# Patient Record
Sex: Female | Born: 1985 | Race: Black or African American | Hispanic: No | Marital: Single | Smoking: Current every day smoker
Health system: Southern US, Community
[De-identification: ages and names within clinical notes are randomized; demographics above are authoritative.]

## PROBLEM LIST (undated history)

## (undated) DIAGNOSIS — E669 Obesity, unspecified: Secondary | ICD-10-CM

---
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
## 2004-09-05 ENCOUNTER — Ambulatory Visit (HOSPITAL_COMMUNITY): Admission: RE | Admit: 2004-09-05 | Discharge: 2004-09-05 | Payer: Self-pay | Admitting: Family Medicine

## 2005-04-22 ENCOUNTER — Emergency Department (HOSPITAL_COMMUNITY): Admission: EM | Admit: 2005-04-22 | Discharge: 2005-04-23 | Payer: Self-pay | Admitting: Emergency Medicine

## 2006-02-22 ENCOUNTER — Emergency Department (HOSPITAL_COMMUNITY): Admission: EM | Admit: 2006-02-22 | Discharge: 2006-02-22 | Payer: Self-pay | Admitting: Emergency Medicine

## 2006-07-03 ENCOUNTER — Emergency Department (HOSPITAL_COMMUNITY): Admission: EM | Admit: 2006-07-03 | Discharge: 2006-07-03 | Payer: Self-pay | Admitting: Emergency Medicine

## 2006-10-05 ENCOUNTER — Emergency Department (HOSPITAL_COMMUNITY): Admission: EM | Admit: 2006-10-05 | Discharge: 2006-10-05 | Payer: Self-pay | Admitting: Emergency Medicine

## 2007-01-16 ENCOUNTER — Emergency Department (HOSPITAL_COMMUNITY): Admission: EM | Admit: 2007-01-16 | Discharge: 2007-01-16 | Payer: Self-pay | Admitting: Family Medicine

## 2007-11-06 ENCOUNTER — Emergency Department (HOSPITAL_COMMUNITY): Admission: EM | Admit: 2007-11-06 | Discharge: 2007-11-06 | Payer: Self-pay | Admitting: Emergency Medicine

## 2008-02-05 ENCOUNTER — Emergency Department (HOSPITAL_COMMUNITY): Admission: EM | Admit: 2008-02-05 | Discharge: 2008-02-05 | Payer: Self-pay | Admitting: Emergency Medicine

## 2008-04-09 ENCOUNTER — Emergency Department (HOSPITAL_COMMUNITY): Admission: EM | Admit: 2008-04-09 | Discharge: 2008-04-09 | Payer: Self-pay | Admitting: Emergency Medicine

## 2008-08-02 ENCOUNTER — Emergency Department (HOSPITAL_COMMUNITY): Admission: EM | Admit: 2008-08-02 | Discharge: 2008-08-02 | Payer: Self-pay | Admitting: Family Medicine

## 2008-08-05 ENCOUNTER — Emergency Department (HOSPITAL_COMMUNITY): Admission: EM | Admit: 2008-08-05 | Discharge: 2008-08-05 | Payer: Self-pay | Admitting: Emergency Medicine

## 2009-09-01 ENCOUNTER — Emergency Department (HOSPITAL_COMMUNITY): Admission: EM | Admit: 2009-09-01 | Discharge: 2009-09-01 | Payer: Self-pay | Admitting: Emergency Medicine

## 2009-12-27 ENCOUNTER — Inpatient Hospital Stay (HOSPITAL_COMMUNITY)
Admission: AD | Admit: 2009-12-27 | Discharge: 2009-12-28 | Payer: Self-pay | Source: Home / Self Care | Admitting: Obstetrics & Gynecology

## 2010-01-26 ENCOUNTER — Ambulatory Visit: Payer: Self-pay | Admitting: Obstetrics and Gynecology

## 2010-04-19 LAB — POCT PREGNANCY, URINE: Preg Test, Ur: NEGATIVE

## 2010-04-23 LAB — POCT PREGNANCY, URINE: Preg Test, Ur: NEGATIVE

## 2010-04-23 LAB — CBC
HCT: 32.5 % — ABNORMAL LOW (ref 36.0–46.0)
Hemoglobin: 10.9 g/dL — ABNORMAL LOW (ref 12.0–15.0)
MCH: 27.8 pg (ref 26.0–34.0)
MCHC: 33.7 g/dL (ref 30.0–36.0)
MCV: 82.6 fL (ref 78.0–100.0)
RBC: 3.94 MIL/uL (ref 3.87–5.11)

## 2010-04-23 LAB — URINALYSIS, ROUTINE W REFLEX MICROSCOPIC
Ketones, ur: NEGATIVE mg/dL
Nitrite: NEGATIVE
Urobilinogen, UA: 0.2 mg/dL (ref 0.0–1.0)
pH: 5.5 (ref 5.0–8.0)

## 2010-05-16 LAB — URINALYSIS, ROUTINE W REFLEX MICROSCOPIC
Bilirubin Urine: NEGATIVE
Hgb urine dipstick: NEGATIVE
Protein, ur: NEGATIVE mg/dL
Urobilinogen, UA: 0.2 mg/dL (ref 0.0–1.0)

## 2010-05-16 LAB — CBC
Hemoglobin: 12.3 g/dL (ref 12.0–15.0)
MCHC: 33.1 g/dL (ref 30.0–36.0)
MCV: 79.8 fL (ref 78.0–100.0)
Platelets: 299 10*3/uL (ref 150–400)
RBC: 4.79 MIL/uL (ref 3.87–5.11)
RDW: 16.2 % — ABNORMAL HIGH (ref 11.5–15.5)
RDW: 16.5 % — ABNORMAL HIGH (ref 11.5–15.5)
WBC: 13 10*3/uL — ABNORMAL HIGH (ref 4.0–10.5)

## 2010-05-16 LAB — HEMOCCULT GUIAC POC 1CARD (OFFICE): Fecal Occult Bld: NEGATIVE

## 2010-05-16 LAB — POCT I-STAT, CHEM 8
Creatinine, Ser: 0.7 mg/dL (ref 0.4–1.2)
Glucose, Bld: 93 mg/dL (ref 70–99)
HCT: 38 % (ref 36.0–46.0)
Hemoglobin: 12.9 g/dL (ref 12.0–15.0)
Potassium: 3.5 mEq/L (ref 3.5–5.1)
Sodium: 138 mEq/L (ref 135–145)
TCO2: 23 mmol/L (ref 0–100)

## 2010-05-16 LAB — COMPREHENSIVE METABOLIC PANEL
ALT: 23 U/L (ref 0–35)
AST: 19 U/L (ref 0–37)
Albumin: 3.6 g/dL (ref 3.5–5.2)
Albumin: 3.8 g/dL (ref 3.5–5.2)
Alkaline Phosphatase: 54 U/L (ref 39–117)
Calcium: 8.5 mg/dL (ref 8.4–10.5)
Chloride: 106 mEq/L (ref 96–112)
Creatinine, Ser: 0.7 mg/dL (ref 0.4–1.2)
GFR calc Af Amer: >60 mL/min (ref >60)
GFR calc non Af Amer: >60 mL/min (ref >60)
Glucose, Bld: 88 mg/dL (ref 70–99)
Potassium: 3.9 mEq/L (ref 3.5–5.1)
Sodium: 138 mEq/L (ref 135–145)
Total Bilirubin: 0.6 mg/dL (ref 0.3–1.2)
Total Protein: 7.3 g/dL (ref 6.0–8.3)

## 2010-05-16 LAB — POCT URINALYSIS DIP (DEVICE)
Glucose, UA: NEGATIVE mg/dL
Specific Gravity, Urine: >=1.03 (ref 1.005–1.030)
Urobilinogen, UA: 1 mg/dL (ref 0.0–1.0)

## 2010-05-16 LAB — DIFFERENTIAL
Basophils Absolute: 0 10*3/uL (ref 0.0–0.1)
Basophils Relative: 0 % (ref 0–1)
Basophils Relative: 0 % (ref 0–1)
Eosinophils Absolute: 0.2 10*3/uL (ref 0.0–0.7)
Eosinophils Absolute: 0.4 10*3/uL (ref 0.0–0.7)
Eosinophils Relative: 3 % (ref 0–5)
Lymphocytes Relative: 15 % (ref 12–46)
Lymphs Abs: 1.9 10*3/uL (ref 0.7–4.0)
Monocytes Absolute: 1.1 10*3/uL — ABNORMAL HIGH (ref 0.1–1.0)
Monocytes Absolute: 1.1 10*3/uL — ABNORMAL HIGH (ref 0.1–1.0)
Monocytes Relative: 8 % (ref 3–12)
Neutrophils Relative %: 76 % (ref 43–77)

## 2010-05-16 LAB — POCT PREGNANCY, URINE: Preg Test, Ur: NEGATIVE

## 2010-09-24 ENCOUNTER — Emergency Department (HOSPITAL_COMMUNITY)
Admission: EM | Admit: 2010-09-24 | Discharge: 2010-09-24 | Disposition: A | Discharge status: Home / Self Care | Payer: Self-pay | Attending: Emergency Medicine | Admitting: Emergency Medicine

## 2010-09-24 DIAGNOSIS — I10 Essential (primary) hypertension: Secondary | ICD-10-CM | POA: Insufficient documentation

## 2010-09-24 DIAGNOSIS — K589 Irritable bowel syndrome without diarrhea: Secondary | ICD-10-CM | POA: Insufficient documentation

## 2010-09-24 DIAGNOSIS — N898 Other specified noninflammatory disorders of vagina: Secondary | ICD-10-CM | POA: Insufficient documentation

## 2010-09-24 DIAGNOSIS — E669 Obesity, unspecified: Secondary | ICD-10-CM | POA: Insufficient documentation

## 2010-11-11 LAB — POCT I-STAT, CHEM 8
Chloride: 104 mEq/L (ref 96–112)
Creatinine, Ser: 0.9 mg/dL (ref 0.4–1.2)
HCT: 39 % (ref 36.0–46.0)
Hemoglobin: 13.3 g/dL (ref 12.0–15.0)

## 2011-01-19 ENCOUNTER — Emergency Department (HOSPITAL_COMMUNITY)
Admission: EM | Admit: 2011-01-19 | Discharge: 2011-01-19 | Discharge status: Against Medical Advice | Payer: Self-pay | Attending: Emergency Medicine | Admitting: Emergency Medicine

## 2011-01-19 DIAGNOSIS — Z0389 Encounter for observation for other suspected diseases and conditions ruled out: Secondary | ICD-10-CM | POA: Insufficient documentation

## 2011-01-19 NOTE — ED Notes (Signed)
Pt reports that she "is leaving and will come back later" that she has somewhere else to be

## 2011-01-19 NOTE — ED Provider Notes (Signed)
History     CSN: [id number] Arrival date & time: 01/19/2011  1:00 PM   First MD Initiated Contact with Patient 01/19/11 1503      Chief Complaint  Patient presents with  . Influenza    (Consider location/radiation/quality/duration/timing/severity/associated sxs/prior treatment) HPI  History reviewed. No pertinent past medical history.  History reviewed. No pertinent past surgical history.  History reviewed. No pertinent family history.  History  Substance Use Topics  . Smoking status: Current Everyday Smoker    Types: Cigarettes  . Smokeless tobacco: Never Used  . Alcohol Use: No    OB History    Grav Para Term Preterm Abortions TAB SAB Ect Mult Living                  Review of Systems  Allergies  Review of patient's allergies indicates no known allergies.  Home Medications   Current Outpatient Rx  Name Route Sig Dispense Refill  . LEVONORGEST-ETH ESTRAD 91-DAY 0.15-0.03 MG PO TABS Oral Take 1 tablet by mouth daily.        BP 149/72  Pulse 99  Temp(Src) 98.7 F (37.1 C) (Oral)  Resp 20  Ht 5\' 2"  (1.575 m)  Wt 230 lb (104.327 kg)  BMI 42.07 kg/m2  SpO2 99%  LMP 01/19/2011  Physical Exam  ED Course  Procedures (including critical care time)  Labs Reviewed - No data to display No results found.   No diagnosis found.    MDM  Not my patient        Doug Sou, MD 01/19/11 2037

## 2011-01-19 NOTE — ED Notes (Signed)
Symptoms began on Tuesday, sob, body aches, n/v/d, chest congestion

## 2011-01-20 ENCOUNTER — Encounter (HOSPITAL_COMMUNITY): Payer: Self-pay | Admitting: Emergency Medicine

## 2011-01-20 ENCOUNTER — Emergency Department (HOSPITAL_COMMUNITY)
Admission: EM | Admit: 2011-01-20 | Discharge: 2011-01-20 | Disposition: A | Discharge status: Home / Self Care | Payer: Self-pay | Attending: Emergency Medicine | Admitting: Emergency Medicine

## 2011-01-20 ENCOUNTER — Emergency Department (HOSPITAL_COMMUNITY): Payer: Self-pay

## 2011-01-20 DIAGNOSIS — R05 Cough: Secondary | ICD-10-CM | POA: Insufficient documentation

## 2011-01-20 DIAGNOSIS — B9789 Other viral agents as the cause of diseases classified elsewhere: Secondary | ICD-10-CM | POA: Insufficient documentation

## 2011-01-20 DIAGNOSIS — R070 Pain in throat: Secondary | ICD-10-CM | POA: Insufficient documentation

## 2011-01-20 DIAGNOSIS — B349 Viral infection, unspecified: Secondary | ICD-10-CM

## 2011-01-20 DIAGNOSIS — IMO0001 Reserved for inherently not codable concepts without codable children: Secondary | ICD-10-CM | POA: Insufficient documentation

## 2011-01-20 DIAGNOSIS — R630 Anorexia: Secondary | ICD-10-CM | POA: Insufficient documentation

## 2011-01-20 DIAGNOSIS — R059 Cough, unspecified: Secondary | ICD-10-CM | POA: Insufficient documentation

## 2011-01-20 DIAGNOSIS — R0989 Other specified symptoms and signs involving the circulatory and respiratory systems: Secondary | ICD-10-CM | POA: Insufficient documentation

## 2011-01-20 DIAGNOSIS — R112 Nausea with vomiting, unspecified: Secondary | ICD-10-CM | POA: Insufficient documentation

## 2011-01-20 DIAGNOSIS — R0602 Shortness of breath: Secondary | ICD-10-CM | POA: Insufficient documentation

## 2011-01-20 DIAGNOSIS — R197 Diarrhea, unspecified: Secondary | ICD-10-CM | POA: Insufficient documentation

## 2011-01-20 MED ORDER — ALBUTEROL SULFATE HFA 108 (90 BASE) MCG/ACT IN AERS
2.0000 | INHALATION_SPRAY | RESPIRATORY_TRACT | Status: AC
  Administered 2011-01-20: 2 via RESPIRATORY_TRACT
  Filled 2011-01-20: qty 6.7

## 2011-01-20 MED ORDER — ALBUTEROL SULFATE (5 MG/ML) 0.5% IN NEBU
5.0000 mg | INHALATION_SOLUTION | Freq: Once | RESPIRATORY_TRACT | Status: AC
  Administered 2011-01-20: 5 mg via RESPIRATORY_TRACT
  Filled 2011-01-20: qty 1

## 2011-01-20 MED ORDER — IBUPROFEN 800 MG PO TABS: 800.0000 mg | ORAL_TABLET | Freq: Three times a day (TID) | ORAL | Status: AC

## 2011-01-20 MED ORDER — PROMETHAZINE HCL 25 MG PO TABS: 25.0000 mg | ORAL_TABLET | Freq: Four times a day (QID) | ORAL | Status: AC | PRN

## 2011-01-20 MED ORDER — IPRATROPIUM BROMIDE 0.02 % IN SOLN
0.5000 mg | Freq: Once | RESPIRATORY_TRACT | Status: AC
  Administered 2011-01-20: 0.5 mg via RESPIRATORY_TRACT
  Filled 2011-01-20: qty 2.5

## 2011-01-20 NOTE — ED Notes (Signed)
RT paged for [facility]

## 2011-01-20 NOTE — ED Notes (Signed)
PT. REPORTS PERSISTENT PRODUCTIVE COUGH WITH VOMITTING / DIARRHEA , CHILLS AND LOW GRADE FEVER FOR 4 DAYS.

## 2011-01-20 NOTE — Progress Notes (Signed)
Instructed pt on use of Albuterol MDI.  Pt demonstrated and shows good understanding of use.

## 2011-01-20 NOTE — ED Provider Notes (Signed)
Medical screening examination/treatment/procedure(s) were performed by non-physician practitioner and as supervising physician I was immediately available for consultation/collaboration.   Celene Kras, MD 01/20/11 [phone]

## 2011-01-20 NOTE — ED Notes (Signed)
Patient transported to X-ray 

## 2011-01-20 NOTE — ED Notes (Signed)
C/o prod cough, chest congestion, SOB, generalized body aches. since Tuesday. N/v/d with emesis x2 & diarrhea x 3-4. + chills, denies fever

## 2011-01-20 NOTE — ED Provider Notes (Signed)
History     CSN: [id number] Arrival date & time: 01/20/2011  6:49 AM   First MD Initiated Contact with Patient 01/20/11 [phone]      Chief Complaint  Patient presents with  . Cough    (Consider location/radiation/quality/duration/timing/severity/associated sxs/prior treatment) HPI History provided by pt.   Pt has had cough, SOB that is aggravated by exertion and chest congestion for the past 4 days.  Associated w/ nasal congestion, rhinorrhea, sore throat, N/V/D, decreased appetite and body aches.  Denies fever and abdominal pain.  No PMH.  Smokes cigarettes.    History reviewed. No pertinent past medical history.  History reviewed. No pertinent past surgical history.  No family history on file.  History  Substance Use Topics  . Smoking status: Current Everyday Smoker    Types: Cigarettes  . Smokeless tobacco: Never Used  . Alcohol Use: No    OB History    Grav Para Term Preterm Abortions TAB SAB Ect Mult Living                  Review of Systems  All other systems reviewed and are negative.    Allergies  Review of patient's allergies indicates no known allergies.  Home Medications   Current Outpatient Rx  Name Route Sig Dispense Refill  . LEVONORGEST-ETH ESTRAD 91-DAY 0.15-0.03 MG PO TABS Oral Take 1 tablet by mouth daily.        BP 145/70  Pulse 124  Temp(Src) 98.8 F (37.1 C) (Oral)  Resp 24  SpO2 99%  LMP 01/12/2011  Physical Exam  Nursing note and vitals reviewed. Constitutional: She is oriented to person, place, and time. She appears well-developed and well-nourished. No distress.       Morbidly obese.  Eating [employer].    HENT:  Head: No trismus in the jaw.  Right Ear: Tympanic membrane, external ear and ear canal normal.  Left Ear: Tympanic membrane, external ear and ear canal normal.  Mouth/Throat: Uvula is midline and mucous membranes are normal. No oropharyngeal exudate, posterior oropharyngeal edema or posterior oropharyngeal erythema.      Nasal congestion  Eyes:       Normal appearance  Neck: Normal range of motion. Neck supple.  Cardiovascular: Regular rhythm.        Tachycardia  Pulmonary/Chest: Effort normal and breath sounds normal. No respiratory distress.       Diffuse inspiratory wheezing.  Coughing.  Abdominal: Soft. Bowel sounds are normal. She exhibits no distension. There is no tenderness.  Musculoskeletal: Normal range of motion.  Lymphadenopathy:    She has no cervical adenopathy.  Neurological: She is alert and oriented to person, place, and time.  Skin: Skin is warm and dry. No rash noted.  Psychiatric: She has a normal mood and affect. Her behavior is normal.    ED Course  Procedures (including critical care time)  Labs Reviewed - No data to display Dg Chest 2 View  01/20/2011  *RADIOLOGY REPORT*  Clinical Data: Cough, shortness of breath  CHEST - 2 VIEW  Comparison: None.  Findings: No pneumonia is seen.  There are prominent perihilar markings with some peribronchial thickening which may indicate bronchitis.  The heart is within normal limits in size.  No bony abnormality is seen.  IMPRESSION: No pneumonia.  Peribronchial thickening may indicate bronchitis.  Original Report Authenticated By: Juline Patch, M.D.     1. Viral illness       MDM  Morbidly obese but otherwise healthy 25yo F  presents w/ URI sx x 4 days.  CXR ordered d/t c/o SOB, tachycardia/tachypnea on exam and pt expectation; seen in ED yesterday and told she needed a CXR but had to leave d/t time constraint.  S/sx most consistent w/ viral illness.   CXR shows peribronchial thickening.  Pt received a breathing tmnt w/ improvement in sx and vitals.  Received an albuterol inhaler.  D/c'd home w/ zofran and ibuprofen.  Return precautions discussed.       Otilio Miu, [place] 01/20/11 [phone]

## 2011-08-23 ENCOUNTER — Emergency Department (INDEPENDENT_AMBULATORY_CARE_PROVIDER_SITE_OTHER)
Admission: EM | Admit: 2011-08-23 | Discharge: 2011-08-23 | Disposition: A | Discharge status: Home / Self Care | Payer: Self-pay | Source: Home / Self Care | Attending: Emergency Medicine | Admitting: Emergency Medicine

## 2011-08-23 ENCOUNTER — Encounter (HOSPITAL_COMMUNITY): Payer: Self-pay | Admitting: Emergency Medicine

## 2011-08-23 DIAGNOSIS — R5383 Other fatigue: Secondary | ICD-10-CM

## 2011-08-23 DIAGNOSIS — Z72820 Sleep deprivation: Secondary | ICD-10-CM

## 2011-08-23 LAB — POCT I-STAT, CHEM 8
Creatinine, Ser: 0.9 mg/dL (ref 0.50–1.10)
HCT: 42 % (ref 36.0–46.0)
Hemoglobin: 14.3 g/dL (ref 12.0–15.0)
Potassium: 4 mEq/L (ref 3.5–5.1)
Sodium: 141 mEq/L (ref 135–145)

## 2011-08-23 NOTE — ED Provider Notes (Signed)
History     CSN: [id number]  Arrival date & time 08/23/11  1321   First MD Initiated Contact with Patient 08/23/11 1622      Chief Complaint  Patient presents with  . New Evaluation    (Consider location/radiation/quality/duration/timing/severity/associated sxs/prior treatment) HPI Comments: Pt c/o being "tired all the time" for a year.  Someone at work suggested she have her thyroid checked.  Does not have pcp.  Upon further questioning, pt started new job a year ago and is only able to sleep 4-5 hours per night.  Has been sleeping this little for the last year.  Pt reports always have irregular periods unless she is taking birth control, and stopped her birth control pills about 6 months ago.  In last month, periods are more irregular than usual.   Patient is a 26 y.o. female presenting with weakness. The history is provided by the patient.  Weakness Primary symptoms do not include dizziness or fever. Primary symptoms comment: fatigue Episode onset: a year ago. The symptoms are unchanged.  Additional symptoms do not include weakness. Associated symptoms comments: Irregular periods.    History reviewed. No pertinent past medical history.  History reviewed. No pertinent past surgical history.  History reviewed. No pertinent family history.  History  Substance Use Topics  . Smoking status: Current Everyday Smoker -- 0.5 packs/day for 3 years    Types: Cigarettes  . Smokeless tobacco: Never Used  . Alcohol Use: No    OB History    Grav Para Term Preterm Abortions TAB SAB Ect Mult Living                  Review of Systems  Constitutional: Positive for fatigue. Negative for fever, chills, diaphoresis and unexpected weight change.  Cardiovascular: Negative for chest pain and palpitations.  Genitourinary: Positive for menstrual problem. Negative for dysuria, vaginal bleeding, vaginal discharge, genital sores and vaginal pain.  Skin:       No skin changes in last year    Neurological: Negative for dizziness and weakness.  Hematological: Does not bruise/bleed easily.       No hx anemia    Allergies  Review of patient's allergies indicates no known allergies.  Home Medications  No current outpatient prescriptions on file.  BP 132/69  Pulse 84  Temp 98.6 F (37 C) (Oral)  Resp 16  SpO2 100%  Physical Exam  Constitutional: She is oriented to person, place, and time. She appears well-developed and well-nourished. No distress.       obese  Neck: Neck supple. No mass and no thyromegaly present.  Cardiovascular: Normal rate and regular rhythm.   Pulmonary/Chest: Effort normal and breath sounds normal.  Neurological: She is alert and oriented to person, place, and time.  Skin: Skin is warm and dry.    ED Course  Procedures (including critical care time)   Labs Reviewed  POCT I-STAT, CHEM 8  TSH   No results found.   1. Fatigue   2. Sleep deficient       MDM  Pt most likely sleep deprived, which will make her feel tired and cause irregular periods.  Istat Chem8 normal, TSH drawn but I expect it to be normal.  Pt to seek f/u with gyn or pcp when she can.         Cathlyn Parsons, NP 08/23/11 [phone]

## 2011-08-23 NOTE — ED Notes (Signed)
PT HERE FOR EVALUATION OF THYROID FOR SX IRREGULAR MENSES X1 YR,LACK OF ENERGY AND WEIGHT ISSUES.DENIES PAIN OR OTHER CONCERNS.STATES SHE WAS REFERRED TO [facility] BUT NO APPT AVAILABLE

## 2011-08-24 LAB — TSH: TSH: 1.853 u[IU]/mL (ref 0.350–4.500)

## 2011-08-24 NOTE — ED Provider Notes (Signed)
Labs reveiwed, TSH & istat normal.   Medical screening examination/treatment/procedure(s) were performed by non-physician practitioner and as supervising physician I was immediately available for consultation/collaboration.  Luiz Blare MD   Luiz Blare, MD 08/24/11 2100

## 2011-08-28 ENCOUNTER — Telehealth (HOSPITAL_COMMUNITY): Payer: Self-pay | Admitting: *Deleted

## 2011-08-28 NOTE — ED Notes (Signed)
Pt. called for her lab result.  Pt. verified x 2 and given result and range. ( TSH 1.853 WNL) Vassie Moselle 08/28/2011

## 2012-05-11 ENCOUNTER — Encounter (HOSPITAL_COMMUNITY): Payer: Self-pay | Admitting: Emergency Medicine

## 2012-05-11 ENCOUNTER — Emergency Department (INDEPENDENT_AMBULATORY_CARE_PROVIDER_SITE_OTHER)
Admission: EM | Admit: 2012-05-11 | Discharge: 2012-05-11 | Disposition: A | Discharge status: Home / Self Care | Payer: Self-pay | Source: Home / Self Care

## 2012-05-11 DIAGNOSIS — IMO0002 Reserved for concepts with insufficient information to code with codable children: Secondary | ICD-10-CM

## 2012-05-11 DIAGNOSIS — Z5189 Encounter for other specified aftercare: Secondary | ICD-10-CM

## 2012-05-11 DIAGNOSIS — L02419 Cutaneous abscess of limb, unspecified: Secondary | ICD-10-CM

## 2012-05-11 MED ORDER — DOXYCYCLINE HYCLATE 50 MG PO CAPS: 100.0000 mg | ORAL_CAPSULE | Freq: Two times a day (BID) | ORAL | Status: DC

## 2012-05-11 NOTE — ED Provider Notes (Signed)
History     CSN: [id number]  Arrival date & time 05/11/12  1131   First MD Initiated Contact with Patient 05/11/12 1141      Chief Complaint  Patient presents with  . Abscess    (Consider location/radiation/quality/duration/timing/severity/associated sxs/prior treatment) Patient is a 27 y.o. female presenting with abscess.  Abscess  This is a 27 year old female who presents with swelling in her left axilla which started one week ago. It is quite painful she thinks there is been drainage from the area. She does not have any redness as far as she can tell. She has not had any fevers. History reviewed. No pertinent past medical history.  History reviewed. No pertinent past surgical history.  No family history on file.  History  Substance Use Topics  . Smoking status: Current Every Day Smoker -- 0.50 packs/day for 3 years    Types: Cigarettes  . Smokeless tobacco: Never Used  . Alcohol Use: No    OB History   Grav Para Term Preterm Abortions TAB SAB Ect Mult Living                  Review of Systems  Constitutional: Negative.   HENT: Negative.   Eyes: Negative.   Respiratory: Negative.   Cardiovascular: Negative.   Gastrointestinal: Negative.   Endocrine: Negative.   Genitourinary: Negative.   Musculoskeletal: Negative.   Skin:       Per history of present illness  Neurological: Negative.   Psychiatric/Behavioral: Negative.     Allergies  Review of patient's allergies indicates no known allergies.  Home Medications   Current Outpatient Rx  Name  Route  Sig  Dispense  Refill  . doxycycline (VIBRAMYCIN) 50 MG capsule   Oral   Take 2 capsules (100 mg total) by mouth 2 (two) times daily.   28 capsule   0     BP 119/68  Pulse 70  Temp(Src) 97.9 F (36.6 C) (Oral)  Resp 18  SpO2 100%  LMP 04/20/2012  Physical Exam  Constitutional: She is oriented to person, place, and time. She appears well-developed and well-nourished.  HENT:  Head: Normocephalic  and atraumatic.  Eyes: Conjunctivae are normal. Pupils are equal, round, and reactive to light.  Neck: Normal range of motion. Neck supple.  Cardiovascular: Normal rate and regular rhythm.   Pulmonary/Chest: Effort normal and breath sounds normal.  Abdominal: Soft. Bowel sounds are normal.  Musculoskeletal: Normal range of motion.  Neurological: She is alert and oriented to person, place, and time.  Skin: Skin is warm and dry.  Fluctuant swelling about 2-3 inches in diameter in the right axilla. No erythema noted and no lymphadenopathy noted. currently there is no sign of any drainage.  Psychiatric: Her behavior is normal.  Quite anxious    ED Course  Procedures (including critical care time)  Labs Reviewed - No data to display No results found.   1. Abscess of axilla       MDM  Incision and drainage and packing with iodoform gauze has been performed. She has been prescribed doxycycline 100 mg twice a day. She is advised to return in 2 days or sooner if she sees increase in size of swelling or begins to develop fevers.        Calvert Cantor, MD 05/11/12 [phone]

## 2012-05-11 NOTE — ED Notes (Signed)
Pt is here for an abscess on left axilla onset 1 week Past 3 days getting bigger w/some drainage Pain increases w/activity Denies: f/v/n/d Placed warm compression to site  She is alert and oriented w/no signs of acute distress.

## 2012-05-13 ENCOUNTER — Emergency Department (INDEPENDENT_AMBULATORY_CARE_PROVIDER_SITE_OTHER)
Admission: EM | Admit: 2012-05-13 | Discharge: 2012-05-13 | Disposition: A | Discharge status: Home / Self Care | Payer: Self-pay | Source: Home / Self Care | Attending: Family Medicine | Admitting: Family Medicine

## 2012-05-13 ENCOUNTER — Encounter (HOSPITAL_COMMUNITY): Payer: Self-pay | Admitting: Emergency Medicine

## 2012-05-13 DIAGNOSIS — Z5189 Encounter for other specified aftercare: Secondary | ICD-10-CM

## 2012-05-13 NOTE — ED Notes (Signed)
Pt here for packing removal of abscess. Pt denies any new problems. Has been taking antibiotics as prescribed with no adverse reaction. Patient is alert and oriented.

## 2012-05-13 NOTE — ED Provider Notes (Signed)
History     CSN: [id number]  Arrival date & time 05/13/12  1548   First MD Initiated Contact with Patient 05/13/12 1810      Chief Complaint  Patient presents with  . Follow-up    (Consider location/radiation/quality/duration/timing/severity/associated sxs/prior treatment) HPI Comments: Pt had abscess in L axilla I&D on 4/5.  Here for wound recheck.   Patient is a 27 y.o. female presenting with wound check. The history is provided by the patient.  Wound Check This is a new problem. Episode onset: I and D was 2 days ago. The problem occurs constantly. The problem has been gradually improving. Exacerbated by: touching it. Nothing relieves the symptoms. Treatments tried: is taking antibiotics as prescribed. The treatment provided mild relief.    History reviewed. No pertinent past medical history.  History reviewed. No pertinent past surgical history.  History reviewed. No pertinent family history.  History  Substance Use Topics  . Smoking status: Current Every Day Smoker -- 0.50 packs/day for 3 years    Types: Cigarettes  . Smokeless tobacco: Never Used  . Alcohol Use: No    OB History   Grav Para Term Preterm Abortions TAB SAB Ect Mult Living                  Review of Systems  Constitutional: Negative for fever and chills.  Skin: Positive for wound.    Allergies  Review of patient's allergies indicates no known allergies.  Home Medications   Current Outpatient Rx  Name  Route  Sig  Dispense  Refill  . doxycycline (VIBRAMYCIN) 50 MG capsule   Oral   Take 2 capsules (100 mg total) by mouth 2 (two) times daily.   28 capsule   0     BP 142/83  Pulse 87  Temp(Src) 98.3 F (36.8 C) (Oral)  Resp 16  SpO2 99%  LMP 04/20/2012  Physical Exam  Constitutional: She appears well-developed and well-nourished. No distress.  Skin: Skin is warm and dry.  L axilla wound. Still with significant induration surrounding incision. Serosanguinous drainage noted. No  obvious pus. No erythema. Tender to palp.     ED Course  Wound packing Date/Time: 05/13/2012 6:15 PM Performed by: Cathlyn Parsons Authorized by: Bradd Canary D Consent: Verbal consent obtained. Consent given by: patient Patient understanding: patient states understanding of the procedure being performed Patient identity confirmed: verbally with patient Local anesthesia used: no Patient sedated: no Comments: Iodoform gauze removed, as wound is quite large, wound was repacked with 1/4" iodoform gauze.    (including critical care time)  Labs Reviewed - No data to display No results found.   1. Wound check, abscess       MDM          Cathlyn Parsons, NP 05/13/12 1816

## 2012-05-14 LAB — CULTURE, ROUTINE-ABSCESS

## 2012-05-15 NOTE — ED Provider Notes (Signed)
Medical screening examination/treatment/procedure(s) were performed by resident physician or non-physician practitioner and as supervising physician I was immediately available for consultation/collaboration.   Marie Chow DOUGLAS MD.   Amairani Shuey D Emersen Carroll, MD 05/15/12 1525 

## 2012-05-16 ENCOUNTER — Telehealth (HOSPITAL_COMMUNITY): Payer: Self-pay | Admitting: *Deleted

## 2012-05-16 NOTE — ED Notes (Signed)
Abscess culture axilla: Few Proteus Mirabilis.  Pt. treated with I and D and Doxycycline.  Doxycycline not on sensitivity report. Lab shown to Dr. Lorenz Coaster and he said call for clinical improvement. If not getting better pt. will need Cipro or Amoxicillin.I called pt. for clinical improvement. She said it is improving. She is pulling 1 " of packing out since Wednesday as instructed with minimal drainage. I told her to finish all of antibiotic. Come back if not better or getting worse in any way.  Pt. voiced understanding. Pt.'s questions answered. Vassie Moselle 05/16/2012

## 2012-07-17 IMAGING — CR DG CHEST 2V
2 series · 2 of 2 positions shown · non-contrast
Comparison: None.

CLINICAL DATA: Cough, shortness of breath

CHEST - 2 VIEW

[w chest pa]
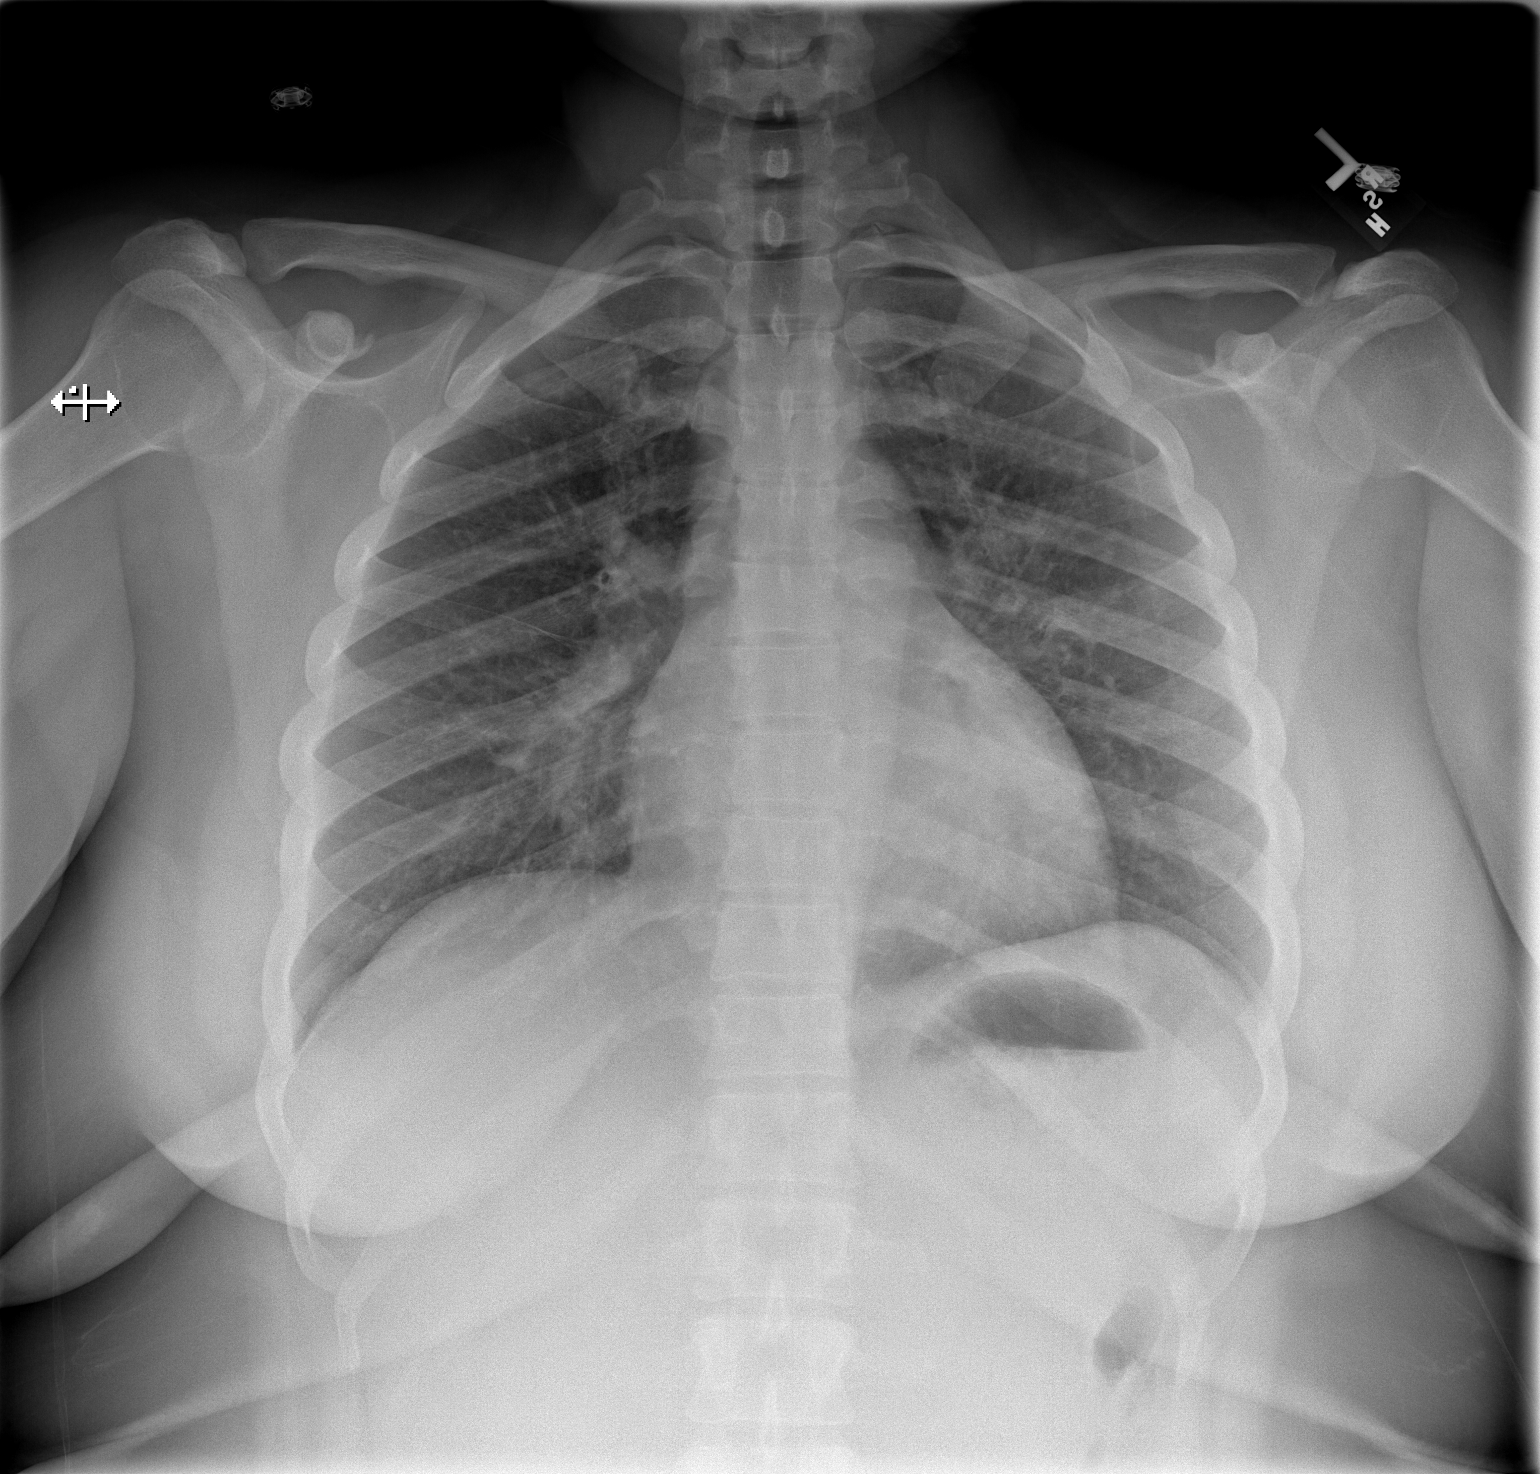

[w chest lat]
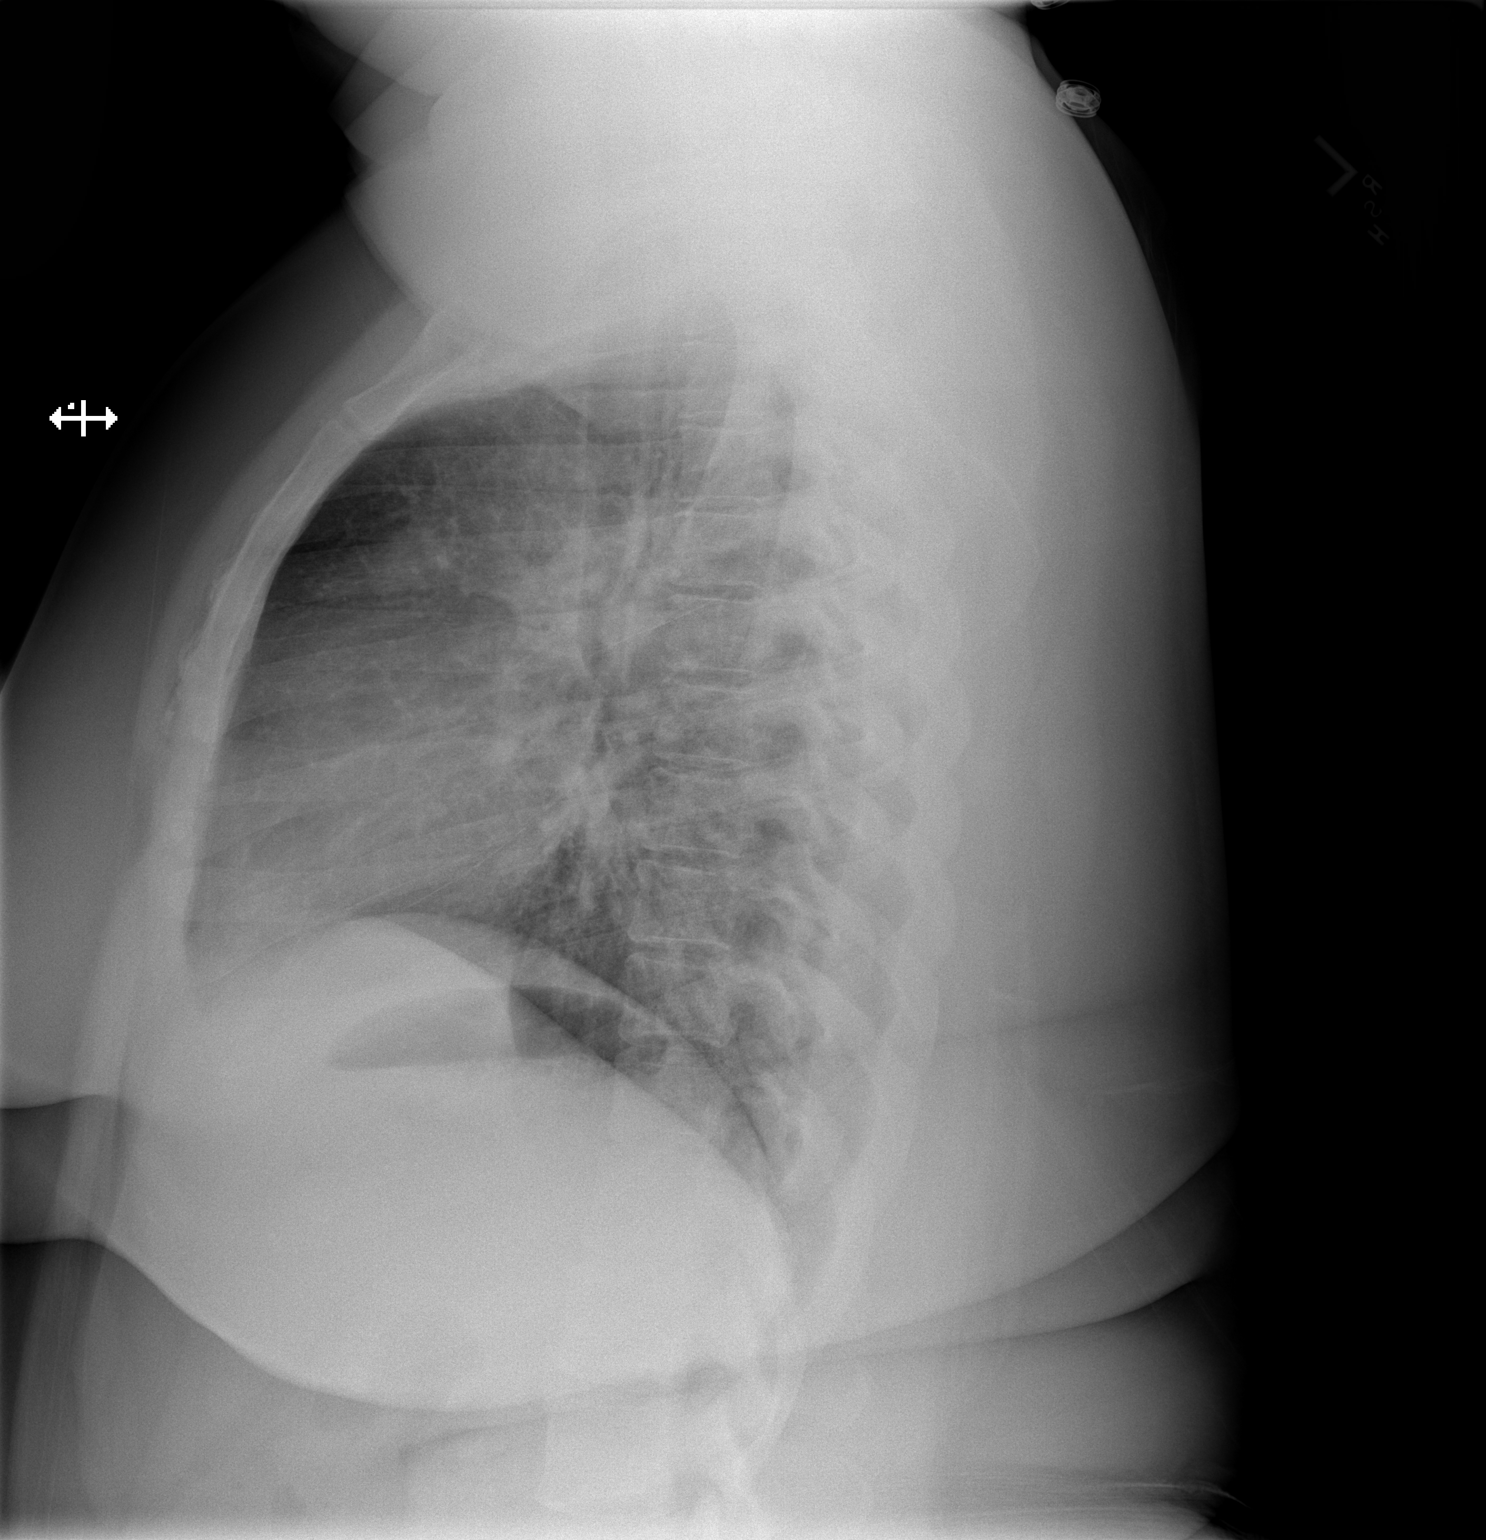

[2 of 2 positions shown; findings below may reference images not displayed]

FINDINGS: No pneumonia is seen.  There are prominent perihilar
markings with some peribronchial thickening which may indicate
bronchitis.  The heart is within normal limits in size.  No bony
abnormality is seen.
IMPRESSION: No pneumonia.  Peribronchial thickening may indicate bronchitis.

## 2014-01-10 ENCOUNTER — Encounter (HOSPITAL_COMMUNITY): Payer: Self-pay | Admitting: *Deleted

## 2014-01-10 ENCOUNTER — Emergency Department (HOSPITAL_COMMUNITY)
Admission: EM | Admit: 2014-01-10 | Discharge: 2014-01-10 | Disposition: A | Discharge status: Home / Self Care | Payer: No Typology Code available for payment source | Attending: Emergency Medicine | Admitting: Emergency Medicine

## 2014-01-10 DIAGNOSIS — Z792 Long term (current) use of antibiotics: Secondary | ICD-10-CM | POA: Insufficient documentation

## 2014-01-10 DIAGNOSIS — E669 Obesity, unspecified: Secondary | ICD-10-CM | POA: Diagnosis not present

## 2014-01-10 DIAGNOSIS — Z720 Tobacco use: Secondary | ICD-10-CM | POA: Insufficient documentation

## 2014-01-10 DIAGNOSIS — Z3202 Encounter for pregnancy test, result negative: Secondary | ICD-10-CM | POA: Diagnosis not present

## 2014-01-10 DIAGNOSIS — N938 Other specified abnormal uterine and vaginal bleeding: Secondary | ICD-10-CM | POA: Insufficient documentation

## 2014-01-10 DIAGNOSIS — N939 Abnormal uterine and vaginal bleeding, unspecified: Secondary | ICD-10-CM

## 2014-01-10 HISTORY — DX: Obesity, unspecified: E66.9

## 2014-01-10 LAB — BASIC METABOLIC PANEL
Anion gap: 13 (ref 5–15)
BUN: 10 mg/dL (ref 6–23)
CHLORIDE: 103 meq/L (ref 96–112)
CO2: 23 meq/L (ref 19–32)
Calcium: 9 mg/dL (ref 8.4–10.5)
Creatinine, Ser: 0.73 mg/dL (ref 0.50–1.10)
GFR calc Af Amer: >90 mL/min (ref >90)
GLUCOSE: 84 mg/dL (ref 70–99)
POTASSIUM: 3.8 meq/L (ref 3.7–5.3)
SODIUM: 139 meq/L (ref 137–147)

## 2014-01-10 LAB — CBC WITH DIFFERENTIAL/PLATELET
Basophils Absolute: 0 10*3/uL (ref 0.0–0.1)
Basophils Relative: 0 % (ref 0–1)
EOS ABS: 0.3 10*3/uL (ref 0.0–0.7)
Eosinophils Relative: 4 % (ref 0–5)
HCT: 35.3 % — ABNORMAL LOW (ref 36.0–46.0)
HEMOGLOBIN: 11.8 g/dL — AB (ref 12.0–15.0)
LYMPHS ABS: 2.5 10*3/uL (ref 0.7–4.0)
LYMPHS PCT: 31 % (ref 12–46)
MCH: 28 pg (ref 26.0–34.0)
MCHC: 33.4 g/dL (ref 30.0–36.0)
MCV: 83.8 fL (ref 78.0–100.0)
Monocytes Absolute: 0.4 10*3/uL (ref 0.1–1.0)
Monocytes Relative: 5 % (ref 3–12)
NEUTROS ABS: 4.7 10*3/uL (ref 1.7–7.7)
NEUTROS PCT: 59 % (ref 43–77)
PLATELETS: 286 10*3/uL (ref 150–400)
RBC: 4.21 MIL/uL (ref 3.87–5.11)
RDW: 12.8 % (ref 11.5–15.5)
WBC: 7.9 10*3/uL (ref 4.0–10.5)

## 2014-01-10 LAB — URINALYSIS, ROUTINE W REFLEX MICROSCOPIC
Bilirubin Urine: NEGATIVE
GLUCOSE, UA: NEGATIVE mg/dL
Ketones, ur: NEGATIVE mg/dL
LEUKOCYTES UA: NEGATIVE
Nitrite: NEGATIVE
PROTEIN: NEGATIVE mg/dL
SPECIFIC GRAVITY, URINE: 1.026 (ref 1.005–1.030)
Urobilinogen, UA: 0.2 mg/dL (ref 0.0–1.0)
pH: 5.5 (ref 5.0–8.0)

## 2014-01-10 LAB — WET PREP, GENITAL
Trich, Wet Prep: NONE SEEN
Yeast Wet Prep HPF POC: NONE SEEN

## 2014-01-10 LAB — URINE MICROSCOPIC-ADD ON

## 2014-01-10 LAB — PREGNANCY, URINE: PREG TEST UR: NEGATIVE

## 2014-01-10 MED ORDER — FERROUS SULFATE 325 (65 FE) MG PO TABS: 325.0000 mg | ORAL_TABLET | Freq: Every day | ORAL | Status: DC

## 2014-01-10 NOTE — ED Notes (Signed)
Pt reports having her menstrual cycle since 108/24, no acute distress noted at triage.

## 2014-01-10 NOTE — ED Provider Notes (Signed)
CSN: [id number]     Arrival date & time 01/10/14  1449 History   First MD Initiated Contact with Patient 01/10/14 1615     Chief Complaint  Patient presents with  . Vaginal Bleeding     (Consider location/radiation/quality/duration/timing/severity/associated sxs/prior Treatment) The history is provided by the patient.  Jessica Foster is a 28 y.o. female hx of obesity here with vaginal bleeding. Vaginal bleeding for the last 2 months. States that sometimes she is spotting but most the time is about 2 pads a day. Feels a little lightheaded but denies any chest pain or syncope. Denis abdominal pain or vaginal discharge. States that last time she had this she was diagnosed with STD but hasn't been sexually active since May.    Past Medical History  Diagnosis Date  . Obesity    History reviewed. No pertinent past surgical history. History reviewed. No pertinent family history. History  Substance Use Topics  . Smoking status: Current Every Day Smoker -- 0.50 packs/day for 3 years    Types: Cigarettes  . Smokeless tobacco: Never Used  . Alcohol Use: No   OB History    No data available     Review of Systems  Genitourinary: Positive for vaginal bleeding.  All other systems reviewed and are negative.     Allergies  Review of patient's allergies indicates no known allergies.  Home Medications   Prior to Admission medications   Medication Sig Start Date End Date Taking? Authorizing Provider  Homeopathic Products (ALLERGY MEDICINE PO) Take 1 tablet by mouth daily as needed (for allergies).   Yes Historical Provider, MD  ibuprofen (ADVIL,MOTRIN) 200 MG tablet Take 400 mg by mouth every 6 (six) hours as needed for moderate pain.   Yes Historical Provider, MD  Iron TABS Take 1 tablet by mouth as needed (for weakness).   Yes Historical Provider, MD  doxycycline (VIBRAMYCIN) 50 MG capsule Take 2 capsules (100 mg total) by mouth 2 (two) times daily. 05/11/12   Calvert CantorSaima Rizwan, MD   BP  108/63 mmHg  Pulse 73  Temp(Src) 97.8 F (36.6 C)  Resp 16  Ht 5\' 2"  (1.575 m)  Wt 230 lb (104.327 kg)  BMI 42.06 kg/m2  SpO2 98%  LMP 11/29/2013 Physical Exam  Constitutional: She is oriented to person, place, and time. She appears well-developed and well-nourished.  HENT:  Head: Normocephalic.  Mouth/Throat: Oropharynx is clear and moist.  Eyes: Conjunctivae are normal. Pupils are equal, round, and reactive to light.  Neck: Normal range of motion. Neck supple.  Cardiovascular: Normal rate, regular rhythm and normal heart sounds.   Pulmonary/Chest: Effort normal and breath sounds normal. No respiratory distress. She has no wheezes. She has no rales.  Abdominal: Soft. Bowel sounds are normal. She exhibits no distension. There is no tenderness. There is no rebound and no guarding.  Overweight   Genitourinary:  Minimal blood at os.   Musculoskeletal: Normal range of motion. She exhibits no edema or tenderness.  Neurological: She is alert and oriented to person, place, and time. No cranial nerve deficit. Coordination normal.  Skin: Skin is warm and dry.  Psychiatric: She has a normal mood and affect. Her behavior is normal. Judgment and thought content normal.  Nursing note and vitals reviewed.   ED Course  Procedures (including critical care time) Labs Review Labs Reviewed  WET PREP, GENITAL - Abnormal; Notable for the following:    Clue Cells Wet Prep HPF POC RARE (*)    WBC, Wet  Prep HPF POC MODERATE (*)    All other components within normal limits  CBC WITH DIFFERENTIAL - Abnormal; Notable for the following:    Hemoglobin 11.8 (*)    HCT 35.3 (*)    All other components within normal limits  URINALYSIS, ROUTINE W REFLEX MICROSCOPIC - Abnormal; Notable for the following:    APPearance CLOUDY (*)    Hgb urine dipstick LARGE (*)    All other components within normal limits  URINE MICROSCOPIC-ADD ON - Abnormal; Notable for the following:    Squamous Epithelial / LPF FEW  (*)    Bacteria, UA FEW (*)    All other components within normal limits  GC/CHLAMYDIA PROBE AMP  BASIC METABOLIC PANEL  PREGNANCY, URINE    Imaging Review No results found.   EKG Interpretation None      MDM   Final diagnoses:  None    Jessica BergeronDevin S Foster is a 28 y.o. female here with vaginal bleeding. Likely PCOS vs fibroids. Will check H/h. Will send STD panel.  6:07 PM Hg stable. Wet prep has few WBC. I don't think she has BV since she has no discharge. Will refer to women's to discuss contraception.      Richardean Canalavid H Yao, MD 01/10/14 [phone]

## 2014-01-10 NOTE — Discharge Instructions (Signed)
Stay hydrated.   Follow up with [facility] center to discuss options.   Return to ER if you have worse bleeding, vomiting, passing out.

## 2014-01-12 LAB — GC/CHLAMYDIA PROBE AMP
CT Probe RNA: NEGATIVE
GC Probe RNA: NEGATIVE

## 2018-12-30 ENCOUNTER — Ambulatory Visit (HOSPITAL_COMMUNITY)
Admission: RE | Admit: 2018-12-30 | Discharge: 2018-12-30 | Disposition: A | Discharge status: Home / Self Care | Payer: Self-pay | Attending: Psychiatry | Admitting: Psychiatry

## 2018-12-30 DIAGNOSIS — F22 Delusional disorders: Secondary | ICD-10-CM | POA: Insufficient documentation

## 2018-12-30 DIAGNOSIS — F419 Anxiety disorder, unspecified: Secondary | ICD-10-CM | POA: Insufficient documentation

## 2018-12-30 DIAGNOSIS — R450 Nervousness: Secondary | ICD-10-CM | POA: Insufficient documentation

## 2018-12-30 NOTE — H&P (Signed)
Behavioral Health Medical Screening Exam  Jessica Foster is an 33 y.o. female presents under IVC by her bother  Jessica Foster) brother reported patient is paranoid and talking to herself. States her mood and behavior has gotten worse for the past 2 years, after the passing of their mother and he stated he felt has if her church family is a "cult." Brother denied family history with mental illness. Reported patient my use mariajuana however is unsure.   Jessica Foster seen sitting in dayroom slightly irritable during this assessment. Patient is denying suicidal or homicidal ideations. Denies auditory or visual  hallucination Denies depression of depressive symptom. Patient is awake, alert and orentiend *3. Patient has declined outpatient resources.  Citing " there's nothing wrong with me." denied previous inpatient admissions. Denied substance abuse use. Support, encouragement and reassurance was provided.   Total Time spent with patient: 15 minutes  Psychiatric Specialty Exam: Physical Exam  Vitals reviewed. Constitutional: She appears well-developed.  Psychiatric: She has a normal mood and affect. Her behavior is normal.    Review of Systems  Psychiatric/Behavioral: Negative for hallucinations and suicidal ideas. The patient is nervous/anxious.   All other systems reviewed and are negative.   Blood pressure (!) 150/100, pulse (!) 118, temperature 98.1 F (36.7 C), temperature source Oral, resp. rate 16, SpO2 100 %.There is no height or weight on file to calculate BMI.  General Appearance: Guarded  Eye Contact:  Good  Speech:  Clear and Coherent  Volume:  Normal  Mood:  Anxious and Irritable  Affect:  Congruent  Thought Process:  Coherent  Orientation:  Full (Time, Place, and Person)  Thought Content:  Logical  Suicidal Thoughts:  No  Homicidal Thoughts:  No  Memory:  Immediate;   Fair Recent;   Fair  Judgement:  Fair  Insight:  Fair  Psychomotor Activity:  Normal  Concentration: Concentration:  Fair  Recall:  [employer] of Knowledge:Fair  Language: Fair  Akathisia:  No  Handed:  Right  AIMS (if indicated):     Assets:  Communication Skills Desire for Improvement Resilience Social Support  Sleep:       Musculoskeletal: Strength & Muscle Tone: within normal limits Gait & Station: unsteady Patient leans: N/A  Blood pressure (!) 150/100, pulse (!) 118, temperature 98.1 F (36.7 C), temperature source Oral, resp. rate 16, SpO2 100 %.  Recommendations: Patient declined outpatient resources.  Based on my evaluation the patient does not appear to have an emergency medical condition.  Derrill Center, NP 12/30/2018, 2:25 PM

## 2018-12-30 NOTE — BH Assessment (Signed)
Assessment Note  Jessica Foster is an 33 y.o. female resent to [facility] via IVC taken out by her brother. Per IVC, "Patient is a danger to self and others, to with: she constantly rants to herself sounding like she is having conversations with others; appears to be paranoid and having auditory hallucinations; petitions fears she will act out what the voices tell her; she has repeatedly threatened to kill "The Fat Bitch" and respondent believes she is referring to herself; overeats and doesn't take care of personal hygiene; respondent sleeps in a locked room and has hidden all sharp objects in the home."   Patient denied all allegations that she has a mental illness, currently or in the past. Patient denied ever being hospitalized, denied suicidal/homicidal ideations, denied auditory/visual hallucinations. Patient voiced frustration and appeared irritable due to being taken out of her home by [facility] and brought to the hospital against her well. Patient reported she had been stressed due to losing her job and living on the street for a few months. Patient currently lives with her brother. Patient denied past experiencing traumatic events (physical/verbal/sexual abuse).   Collateral: Patient's brother report patient symptoms have been getting progressively worse over the past two years. Report, "all this started when she started attending this church." Brother stated she's acting like she's talking on the phone to someone, but her phone does not work. Report he even turned off the wifi one day so patient could not have access to the Internet. Report she was still talking on the phone like she's talking to someone. Brother denied a history of family mental illness and substance use. Report after the death of their mother in 06/02/07 patient has not been the same.     Diagnosis: no diagnosis   Past Medical History:  Past Medical History:  Diagnosis Date  . Obesity     No past surgical history on file.  Family  History: No family history on file.  Social History:  reports that she has been smoking cigarettes. She has a 1.50 pack-year smoking history. She has never used smokeless tobacco. She reports that she does not drink alcohol or use drugs.  Additional Social History:  Alcohol / Drug Use Pain Medications: see MAR Prescriptions: see MAR Over the Counter: see MAR History of alcohol / drug use?: No history of alcohol / drug abuse(denies substance use history)  CIWA: CIWA-Ar BP: (!) 150/100 Pulse Rate: (!) 118 COWS:    Allergies: No Known Allergies  Home Medications: (Not in a hospital admission)   OB/GYN Status:  No LMP recorded.  General Assessment Data Location of Assessment: [facility](walk-in) TTS Assessment: In system Is this a Tele or Face-to-Face Assessment?: Face-to-Face Is this an Initial Assessment or a Re-assessment for this encounter?: Initial Assessment Patient Accompanied by:: Other(IVC'd accompanied by [employer]) Language Other than English: No Living Arrangements: Other (Comment)(live with brother) What gender do you identify as?: Female Marital status: Single Living Arrangements: Other relatives(live with brother ) Can pt return to current living arrangement?: Yes Admission Status: Involuntary(IVC'd by brother) Petitioner: Family member(brother - Joylene Draft) Is patient capable of signing voluntary admission?: No(IVC'd ) Referral Source: Other(IVC by brother Joylene Draft)  Medical Screening Exam [facility] Walk-in ONLY) Medical Exam completed: Yes  Crisis Care Plan Living Arrangements: Other relatives(live with brother ) Name of Psychiatrist: denied Name of Therapist: denied  Education Status Is patient currently in school?: No Is the patient employed, unemployed or receiving disability?: Unemployed  Risk to self with the  past 6 months Suicidal Ideation: No Has patient been a risk to self within the past 6 months prior to admission? : No Suicidal  Intent: No Has patient had any suicidal intent within the past 6 months prior to admission? : No Is patient at risk for suicide?: No Suicidal Plan?: No Has patient had any suicidal plan within the past 6 months prior to admission? : No Access to Means: No What has been your use of drugs/alcohol within the last 12 months?: denied  Previous Attempts/Gestures: No(denied ) How many times?: 0(denied ) Other Self Harm Risks: denied  Triggers for Past Attempts: None known Intentional Self Injurious Behavior: None Family Suicide History: No Recent stressful life event(s): Other (Comment)(pt stated being unemployed ) Persecutory voices/beliefs?: No Depression: No(pt denied ) Depression Symptoms: (client denied ) Substance abuse history and/or treatment for substance abuse?: No Suicide prevention information given to non-admitted patients: Not applicable  Risk to Others within the past 6 months Homicidal Ideation: No Does patient have any lifetime risk of violence toward others beyond the six months prior to admission? : No Thoughts of Harm to Others: No Current Homicidal Intent: No Current Homicidal Plan: No Access to Homicidal Means: No Identified Victim: none report  History of harm to others?: No Assessment of Violence: None Noted Violent Behavior Description: none noted Does patient have access to weapons?: No Criminal Charges Pending?: No Does patient have a court date: No Is patient on probation?: No  Psychosis Hallucinations: None noted Delusions: None noted  Mental Status Report Appearance/Hygiene: Disheveled(hair appeared not combed) Eye Contact: Good Motor Activity: Freedom of movement Speech: Logical/coherent Level of Consciousness: Alert Mood: Other (Comment)(frustrated; due to being ) Affect: Irritable Anxiety Level: Minimal Thought Processes: Coherent, Relevant Judgement: Unimpaired Orientation: Person, Place, Time, Situation Obsessive Compulsive  Thoughts/Behaviors: None  Cognitive Functioning Concentration: Normal Memory: Recent Intact, Remote Intact Is patient IDD: No Insight: Fair Impulse Control: Good Appetite: Good Have you had any weight changes? : No Change Sleep: No Change Total Hours of Sleep: (report sleep regularly ) Vegetative Symptoms: None  ADLScreening [facility] Assessment Services) Patient's cognitive ability adequate to safely complete daily activities?: Yes Patient able to express need for assistance with ADLs?: Yes Independently performs ADLs?: Yes (appropriate for developmental age)  Prior Inpatient Therapy Prior Inpatient Therapy: No  Prior Outpatient Therapy Prior Outpatient Therapy: No Does patient have an ACCT team?: No Does patient have Intensive In-House Services?  : No Does patient have [facility] services? : No Does patient have P4CC services?: No  ADL Screening (condition at time of admission) Patient's cognitive ability adequate to safely complete daily activities?: Yes Is the patient deaf or have difficulty hearing?: No Does the patient have difficulty seeing, even when wearing glasses/contacts?: No Does the patient have difficulty concentrating, remembering, or making decisions?: No Patient able to express need for assistance with ADLs?: Yes Does the patient have difficulty dressing or bathing?: No Independently performs ADLs?: Yes (appropriate for developmental age) Does the patient have difficulty walking or climbing stairs?: No       Abuse/Neglect Assessment (Assessment to be complete while patient is alone) Abuse/Neglect Assessment Can Be Completed: Yes(denied traumatic history) Physical Abuse: Denies Verbal Abuse: Denies Sexual Abuse: Denies Exploitation of patient/patient's resources: Denies Self-Neglect: Denies     Merchant navy officerAdvance Directives (For Healthcare) Does Patient Have a Medical Advance Directive?: No Would patient like information on creating a medical advance directive?: No  - Patient declined          Disposition:  Disposition Initial Assessment Completed for this Encounter: Darci Current, NP, patient does meet criteria for inpt tx) Disposition of Patient: Discharge(Tanika Bobby Rumpf, NP, pt does not meet critieria for admission )  On Site Evaluation by:   Reviewed with Physician:    Despina Hidden 12/30/2018 3:26 PM

## 2019-08-22 ENCOUNTER — Emergency Department (HOSPITAL_COMMUNITY)
Admission: EM | Admit: 2019-08-22 | Discharge: 2019-08-23 | Disposition: A | Discharge status: Home / Self Care | Payer: Self-pay | Attending: Emergency Medicine | Admitting: Emergency Medicine

## 2019-08-22 DIAGNOSIS — Z046 Encounter for general psychiatric examination, requested by authority: Secondary | ICD-10-CM | POA: Insufficient documentation

## 2019-08-22 DIAGNOSIS — F29 Unspecified psychosis not due to a substance or known physiological condition: Secondary | ICD-10-CM | POA: Diagnosis present

## 2019-08-22 DIAGNOSIS — F17210 Nicotine dependence, cigarettes, uncomplicated: Secondary | ICD-10-CM | POA: Insufficient documentation

## 2019-08-22 DIAGNOSIS — Z20822 Contact with and (suspected) exposure to covid-19: Secondary | ICD-10-CM | POA: Insufficient documentation

## 2019-08-22 DIAGNOSIS — Z79899 Other long term (current) drug therapy: Secondary | ICD-10-CM | POA: Insufficient documentation

## 2019-08-22 DIAGNOSIS — Z008 Encounter for other general examination: Secondary | ICD-10-CM

## 2019-08-22 DIAGNOSIS — R440 Auditory hallucinations: Secondary | ICD-10-CM | POA: Insufficient documentation

## 2019-08-22 LAB — RAPID URINE DRUG SCREEN, HOSP PERFORMED
Amphetamines: NOT DETECTED
Barbiturates: NOT DETECTED
Benzodiazepines: NOT DETECTED
Cocaine: NOT DETECTED
Opiates: NOT DETECTED
Tetrahydrocannabinol: NOT DETECTED

## 2019-08-22 LAB — COMPREHENSIVE METABOLIC PANEL
ALT: 16 U/L (ref 0–44)
AST: 15 U/L (ref 15–41)
Albumin: 3.7 g/dL (ref 3.5–5.0)
Alkaline Phosphatase: 44 U/L (ref 38–126)
Anion gap: 9 (ref 5–15)
BUN: 11 mg/dL (ref 6–20)
CO2: 22 mmol/L (ref 22–32)
Calcium: 8.8 mg/dL — ABNORMAL LOW (ref 8.9–10.3)
Chloride: 110 mmol/L (ref 98–111)
Creatinine, Ser: 0.81 mg/dL (ref 0.44–1.00)
GFR calc Af Amer: >60 mL/min (ref >60)
GFR calc non Af Amer: >60 mL/min (ref >60)
Glucose, Bld: 119 mg/dL — ABNORMAL HIGH (ref 70–99)
Potassium: 3.4 mmol/L — ABNORMAL LOW (ref 3.5–5.1)
Sodium: 141 mmol/L (ref 135–145)
Total Bilirubin: 0.5 mg/dL (ref 0.3–1.2)
Total Protein: 6.2 g/dL — ABNORMAL LOW (ref 6.5–8.1)

## 2019-08-22 LAB — CBC WITH DIFFERENTIAL/PLATELET
Abs Immature Granulocytes: 0.03 10*3/uL (ref 0.00–0.07)
Basophils Absolute: 0 10*3/uL (ref 0.0–0.1)
Basophils Relative: 1 %
Eosinophils Absolute: 0.2 10*3/uL (ref 0.0–0.5)
Eosinophils Relative: 3 %
HCT: 33.2 % — ABNORMAL LOW (ref 36.0–46.0)
Hemoglobin: 9.9 g/dL — ABNORMAL LOW (ref 12.0–15.0)
Immature Granulocytes: 0 %
Lymphocytes Relative: 14 %
Lymphs Abs: 1 10*3/uL (ref 0.7–4.0)
MCH: 24.6 pg — ABNORMAL LOW (ref 26.0–34.0)
MCHC: 29.8 g/dL — ABNORMAL LOW (ref 30.0–36.0)
MCV: 82.6 fL (ref 80.0–100.0)
Monocytes Absolute: 0.6 10*3/uL (ref 0.1–1.0)
Monocytes Relative: 8 %
Neutro Abs: 5.6 10*3/uL (ref 1.7–7.7)
Neutrophils Relative %: 74 %
Platelets: 301 10*3/uL (ref 150–400)
RBC: 4.02 MIL/uL (ref 3.87–5.11)
RDW: 14 % (ref 11.5–15.5)
WBC: 7.5 10*3/uL (ref 4.0–10.5)
nRBC: 0 % (ref 0.0–0.2)

## 2019-08-22 LAB — SALICYLATE LEVEL: Salicylate Lvl: <7 mg/dL — ABNORMAL LOW (ref 7.0–30.0)

## 2019-08-22 LAB — I-STAT BETA HCG BLOOD, ED (MC, WL, AP ONLY): I-stat hCG, quantitative: <5 m[IU]/mL (ref <5)

## 2019-08-22 LAB — ETHANOL: Alcohol, Ethyl (B): <10 mg/dL (ref <10)

## 2019-08-22 LAB — ACETAMINOPHEN LEVEL: Acetaminophen (Tylenol), Serum: <10 ug/mL — ABNORMAL LOW (ref 10–30)

## 2019-08-22 MED ORDER — ZIPRASIDONE MESYLATE 20 MG IM SOLR
INTRAMUSCULAR | Status: AC
  Filled 2019-08-22: qty 20

## 2019-08-22 MED ORDER — STERILE WATER FOR INJECTION IJ SOLN
INTRAMUSCULAR | Status: AC
  Filled 2019-08-22: qty 10

## 2019-08-22 NOTE — ED Notes (Addendum)
Pt is being seen by TTS.

## 2019-08-22 NOTE — ED Notes (Signed)
EKG given to Dr. Plunkett 

## 2019-08-22 NOTE — ED Notes (Signed)
Pt dressed in purple scrubs 

## 2019-08-22 NOTE — ED Notes (Signed)
Patient belongings such as 2 cellphones and wallet were packed and given to security. Patients personal belongings were placed in purple zone, locker #11.

## 2019-08-22 NOTE — ED Notes (Signed)
Patient is sleeping. No distress noted. This tech by bedside.

## 2019-08-22 NOTE — ED Notes (Signed)
Steak knife and scissors given to security.

## 2019-08-22 NOTE — ED Triage Notes (Signed)
Patient brought in by [facility] after patient was IVC'd for AVH. Patient was initially uncooperative but changed into purple scrubs, voluntary provided a urine specimen, and allowed blood specimens be taken. Patient states she believes that she is being detained against her will and that no one has the right to keep her.

## 2019-08-22 NOTE — ED Provider Notes (Signed)
[facility] EMERGENCY DEPARTMENT Provider Note   CSN: [id number] Arrival date & time: 08/22/19  1732     History Chief Complaint  Patient presents with   Medical Clearance    IVC    Jessica Foster is a 34 y.o. female who presents to the ED under IVC.  Per IVC paperwork: The respondent is talking to the devil. The spirit told her to hurt her guardian with a knife. She believes she must do what the spirit realm tell her to. She is occupying the front porch of the [employer].   Pt is denying any of these allegations. She reports the individual who placed the IVC paperwork is lying and is actually the one who is talking to the devil. Pt denies SI, HI, or AVH.   The history is provided by the patient, the police and medical records.       Past Medical History:  Diagnosis Date   Obesity     There are no problems to display for this patient.   No past surgical history on file.   OB History   No obstetric history on file.     No family history on file.  Social History   Tobacco Use   Smoking status: Current Every Day Smoker    Packs/day: 0.50    Years: 3.00    Pack years: 1.50    Types: Cigarettes   Smokeless tobacco: Never Used  Substance Use Topics   Alcohol use: No   Drug use: No    Home Medications Prior to Admission medications   Medication Sig Start Date End Date Taking? Authorizing Provider  doxycycline (VIBRAMYCIN) 50 MG capsule Take 2 capsules (100 mg total) by mouth 2 (two) times daily. 05/11/12   Calvert Cantor, MD  ferrous sulfate 325 (65 FE) MG tablet Take 1 tablet (325 mg total) by mouth daily. 01/10/14   Charlynne Pander, MD  Homeopathic Products (ALLERGY MEDICINE PO) Take 1 tablet by mouth daily as needed (for allergies).    [provider]  ibuprofen (ADVIL,MOTRIN) 200 MG tablet Take 400 mg by mouth every 6 (six) hours as needed for moderate pain.    [provider]  Iron TABS Take 1 tablet by  mouth as needed (for weakness).    [provider]    Allergies    Patient has no known allergies.  Review of Systems   Review of Systems  Constitutional: Negative for fever.  Psychiatric/Behavioral: Negative for suicidal ideas.  All other systems reviewed and are negative.   Physical Exam Updated Vital Signs BP 133/86 (BP Location: Left Arm)    Pulse (!) 112    Temp 99.4 F (37.4 C) (Oral)    Resp (!) 22    Ht 5\' 2"  (1.575 m)    Wt (!) 140.6 kg    SpO2 98%    BMI 56.70 kg/m   Physical Exam Vitals and nursing note reviewed.  Constitutional:      Comments: Pt initially standing at doorway, yelling stating that we are keeping her against her will  HENT:     Head: Normocephalic and atraumatic.  Eyes:     Conjunctiva/sclera: Conjunctivae normal.  Cardiovascular:     Rate and Rhythm: Normal rate and regular rhythm.  Pulmonary:     Effort: Pulmonary effort is normal.     Breath sounds: Normal breath sounds.  Abdominal:     Palpations: Abdomen is soft.     Tenderness: There  is no abdominal tenderness.  Musculoskeletal:     Cervical back: Neck supple.  Skin:    General: Skin is warm and dry.  Neurological:     Mental Status: She is alert.     ED Results / Procedures / Treatments   Labs (all labs ordered are listed, but only abnormal results are displayed) Labs Reviewed  COMPREHENSIVE METABOLIC PANEL - Abnormal; Notable for the following components:      Result Value   Potassium 3.4 (*)    Glucose, Bld 119 (*)    Calcium 8.8 (*)    Total Protein 6.2 (*)    All other components within normal limits  CBC WITH DIFFERENTIAL/PLATELET - Abnormal; Notable for the following components:   Hemoglobin 9.9 (*)    HCT 33.2 (*)    MCH 24.6 (*)    MCHC 29.8 (*)    All other components within normal limits  SALICYLATE LEVEL - Abnormal; Notable for the following components:   Salicylate Lvl <7.0 (*)    All other components within normal limits  ACETAMINOPHEN LEVEL -  Abnormal; Notable for the following components:   Acetaminophen (Tylenol), Serum <10 (*)    All other components within normal limits  SARS CORONAVIRUS 2 BY RT PCR (HOSPITAL ORDER, PERFORMED IN [facility])  ETHANOL  RAPID URINE DRUG SCREEN, HOSP PERFORMED  I-STAT BETA HCG BLOOD, ED ([facility], [facility], [facility] ONLY)    EKG EKG Interpretation  Date/Time:  Friday August 22 2019 18:28:18 EDT Ventricular Rate:  111 PR Interval:    QRS Duration: 90 QT Interval:  323 QTC Calculation: 439 R Axis:   62 Text Interpretation: Sinus tachycardia Borderline T abnormalities, anterior leads No previous tracing Confirmed by Gwyneth Sprout ([id number]) on 08/22/2019 6:46:43 PM   Radiology No results found.  Procedures Procedures (including critical care time)  Medications Ordered in ED Medications  ziprasidone (GEODON) 20 MG injection (has no administration in time range)  sterile water (preservative free) injection (has no administration in time range)  ferrous sulfate tablet 325 mg (has no administration in time range)    ED Course  I have reviewed the triage vital signs and the nursing notes.  Pertinent labs & imaging results that were available during my care of the patient were reviewed by me and considered in my medical decision making (see chart for details).    MDM Rules/Calculators/A&P                          34 year old female who presents to the ED today under IVC paperwork - see above. There is concern for auditory/visual hallucinations and pt being a threat to her guardian. Per [employer] pt was found on the front porch of [employer] with all her belongings; appears she is homeless. On arrival to the ED pt is afebrile however vitals noted to be positive for tachycardia and tachypnea; I suspect this is s/2 pt yelling at [facility] and staff regarding the fact that she is here against her will. We politely explained the process of the IVC and that she will need to have labwork done and be  evaluated by TTS. Pt initially uncooperative however after some reassurance she agrees to cooperate for exam. She is unwilling to answer any questions in regards to SI, HI, or AVH and states that the person who took out the IVC paperwork is a Sales promotion account executive.   Labwork reassuring at this time. Pt is medically cleared.  Will await TTS consult for further recommendations. COVID test has been ordered. Pt appears to be on iron supplementation; have ordered her home meds.   12:10 AM At shift change case signed out to oncoming ED team who will dispo patient according to TTS recommendations.   Final Clinical Impression(s) / ED Diagnoses Final diagnoses:  Involuntary commitment  Medical clearance for psychiatric admission    Rx / DC Orders ED Discharge Orders    None       Tanda Rockers, PA-C 08/23/19 0011    Gwyneth Sprout, MD 08/24/19 1636

## 2019-08-23 ENCOUNTER — Encounter (HOSPITAL_COMMUNITY): Payer: Self-pay | Admitting: Registered Nurse

## 2019-08-23 ENCOUNTER — Other Ambulatory Visit: Payer: Self-pay

## 2019-08-23 ENCOUNTER — Inpatient Hospital Stay (HOSPITAL_COMMUNITY)
Admission: AD | Admit: 2019-08-23 | Discharge: 2019-08-28 | DRG: 885 | Disposition: A | Discharge status: Home / Self Care | Payer: Federal, State, Local not specified - Other | Source: Intra-hospital | Attending: Psychiatry | Admitting: Psychiatry

## 2019-08-23 DIAGNOSIS — D509 Iron deficiency anemia, unspecified: Secondary | ICD-10-CM | POA: Diagnosis present

## 2019-08-23 DIAGNOSIS — F200 Paranoid schizophrenia: Secondary | ICD-10-CM | POA: Diagnosis not present

## 2019-08-23 DIAGNOSIS — Z9114 Patient's other noncompliance with medication regimen: Secondary | ICD-10-CM

## 2019-08-23 DIAGNOSIS — F419 Anxiety disorder, unspecified: Secondary | ICD-10-CM | POA: Diagnosis present

## 2019-08-23 DIAGNOSIS — Z590 Homelessness: Secondary | ICD-10-CM

## 2019-08-23 DIAGNOSIS — G4700 Insomnia, unspecified: Secondary | ICD-10-CM | POA: Diagnosis present

## 2019-08-23 DIAGNOSIS — F209 Schizophrenia, unspecified: Secondary | ICD-10-CM | POA: Diagnosis present

## 2019-08-23 DIAGNOSIS — F29 Unspecified psychosis not due to a substance or known physiological condition: Secondary | ICD-10-CM | POA: Diagnosis present

## 2019-08-23 DIAGNOSIS — I10 Essential (primary) hypertension: Secondary | ICD-10-CM | POA: Diagnosis present

## 2019-08-23 DIAGNOSIS — F17210 Nicotine dependence, cigarettes, uncomplicated: Secondary | ICD-10-CM | POA: Diagnosis present

## 2019-08-23 LAB — SARS CORONAVIRUS 2 BY RT PCR (HOSPITAL ORDER, PERFORMED IN ~~LOC~~ HOSPITAL LAB): SARS Coronavirus 2: NEGATIVE

## 2019-08-23 MED ORDER — ZIPRASIDONE MESYLATE 20 MG IM SOLR
20.0000 mg | Freq: Once | INTRAMUSCULAR | Status: AC
  Administered 2019-08-23: 20 mg via INTRAMUSCULAR
  Filled 2019-08-23: qty 20

## 2019-08-23 MED ORDER — ALUM & MAG HYDROXIDE-SIMETH 200-200-20 MG/5ML PO SUSP: 30.0000 mL | ORAL | Status: DC | PRN

## 2019-08-23 MED ORDER — FERROUS SULFATE 325 (65 FE) MG PO TABS
325.0000 mg | ORAL_TABLET | Freq: Every day | ORAL | Status: DC
  Filled 2019-08-23: qty 1

## 2019-08-23 MED ORDER — ZIPRASIDONE MESYLATE 20 MG IM SOLR: 10.0000 mg | Freq: Two times a day (BID) | INTRAMUSCULAR | Status: DC | PRN

## 2019-08-23 MED ORDER — MAGNESIUM HYDROXIDE 400 MG/5ML PO SUSP: 30.0000 mL | Freq: Every day | ORAL | Status: DC | PRN

## 2019-08-23 MED ORDER — STERILE WATER FOR INJECTION IJ SOLN
INTRAMUSCULAR | Status: AC
  Administered 2019-08-23: 10 mL
  Filled 2019-08-23: qty 10

## 2019-08-23 MED ORDER — HALOPERIDOL 5 MG PO TABS
5.0000 mg | ORAL_TABLET | ORAL | Status: DC | PRN
  Filled 2019-08-23: qty 1

## 2019-08-23 MED ORDER — HYDROXYZINE HCL 25 MG PO TABS
25.0000 mg | ORAL_TABLET | Freq: Three times a day (TID) | ORAL | Status: DC | PRN
  Filled 2019-08-23: qty 10

## 2019-08-23 MED ORDER — ACETAMINOPHEN 325 MG PO TABS: 650.0000 mg | ORAL_TABLET | Freq: Four times a day (QID) | ORAL | Status: DC | PRN

## 2019-08-23 MED ORDER — TRAZODONE HCL 50 MG PO TABS
50.0000 mg | ORAL_TABLET | Freq: Every evening | ORAL | Status: DC | PRN
  Filled 2019-08-23: qty 7

## 2019-08-23 MED ORDER — OLANZAPINE 5 MG PO TBDP
5.0000 mg | ORAL_TABLET | Freq: Every day | ORAL | Status: DC
  Administered 2019-08-24: 5 mg via ORAL
  Filled 2019-08-23 (×4): qty 1

## 2019-08-23 MED ORDER — LORAZEPAM 1 MG PO TABS: 1.0000 mg | ORAL_TABLET | ORAL | Status: DC | PRN

## 2019-08-23 NOTE — ED Notes (Signed)
Pt ambulated to nurses' desk asking [employer] Officer why she was brought to hospital when she shouldn't have been. States she is supposed to be released. Pt escorted to room - Allowed to vent feelings/concerns. Continues to state the IVC papers are incorrect "because the real judge I am going to take them to will show you how the seal on them is not real". Advised pt of tx plan - BH recommending Inpt.

## 2019-08-23 NOTE — ED Notes (Signed)
[employer] called for transportation to [facility] and report given to [place], [facility]

## 2019-08-23 NOTE — ED Notes (Signed)
Denice Bors, [facility] NP, advised will order psych meds for pt.

## 2019-08-23 NOTE — ED Notes (Addendum)
Pt refusing covid test at this time

## 2019-08-23 NOTE — [facility] Counselor (Addendum)
TTS reassessment: Patient presents laying in bed. She is irritable. She states "Y'all can't keep me here. Those papers are fake. I'm going to sue your ass." Patient refuses to answer further questions.  Per Assunta Found, NP patient meets in patient criteria.

## 2019-08-23 NOTE — ED Notes (Signed)
Pt's lunch ordered 

## 2019-08-23 NOTE — Progress Notes (Addendum)
Per Lamount Cranker, [facility], pt has been accepted to [facility] Number in process of being assigned. Accepting provider is Princella Pellegrini NP, Attending provider is Dr. Jola Babinski MD. Patient can arrive this evening---Arrival time will be provided when RN calls to give report. Number for report is [phone]. CSW called Jerrol Banana RN to notify of above disposition.   Trula Slade, MSW, LCSW Clinical Social Worker 08/23/2019 2:21 PM

## 2019-08-23 NOTE — ED Provider Notes (Signed)
Emergency Medicine Observation Re-evaluation Note  Jessica Foster is a 34 y.o. female, seen on rounds today.  Pt initially presented to the ED for complaints of Medical Clearance (IVC) Currently, the patient is in the ER bed, watching TV.  Physical Exam  BP (!) 127/57 (BP Location: Left Arm)   Pulse 92   Temp 97.9 F (36.6 C) (Oral)   Resp 16   Ht 5\' 2"  (1.575 m)   Wt (!) 140.6 kg   SpO2 99%   BMI 56.70 kg/m  Physical Exam Vitals reviewed.  Constitutional:      Appearance: Normal appearance.  HENT:     Head: Normocephalic and atraumatic.  Eyes:     General:        Right eye: No discharge.        Left eye: No discharge.     Extraocular Movements: Extraocular movements intact.     Conjunctiva/sclera: Conjunctivae normal.  Musculoskeletal:        General: No swelling. Normal range of motion.  Neurological:     General: No focal deficit present.     Mental Status: She is alert and oriented to person, place, and time.  Psychiatric:        Mood and Affect: Mood normal.        Behavior: Behavior normal.     ED Course / MDM  EKG:EKG Interpretation  Date/Time:  Friday August 22 2019 18:28:18 EDT Ventricular Rate:  111 PR Interval:    QRS Duration: 90 QT Interval:  323 QTC Calculation: 439 R Axis:   62 Text Interpretation: Sinus tachycardia Borderline T abnormalities, anterior leads No previous tracing Confirmed by 05-22-2004 (Gwyneth Sprout) on 08/22/2019 6:46:43 PM Also confirmed by 08/24/2019 (Gwyneth Sprout), editor [id number] [phone])  on 08/23/2019 10:47:42 AM    I have reviewed the labs performed to date as well as medications administered while in observation.  Recent changes in the last 24 hours include patient continues to feel heavily question IVC paperwork and states that she will be suing everyone in the hospital.  She remains angry and continues to demand release.  She has been seen by TTS recommends inpatient treatment.  08/25/2019 NP will order psych medications for  patient.  Psych NP recommends inpatient treatment. Plan  Current plan is for patient qualifies for inpatient treatment.  Awaiting placement. Patient is under full IVC at this time.   Darlin Drop 08/23/19 1239    08/25/19, MD 08/23/19 [phone]

## 2019-08-23 NOTE — ED Provider Notes (Signed)
Called by nursing staff as the patient has been getting more agitated, verbally aggressive, and is threatening to leave.  I have ordered 20 of Geodon IM for the patient.  We will continue to monitor.   Mare Ferrari, PA-C 08/23/19 1448    Geoffery Lyons, MD 08/23/19 2040

## 2019-08-23 NOTE — ED Notes (Signed)
Pt with [employer] for transportation to [facility].

## 2019-08-23 NOTE — ED Notes (Signed)
Pt accepted to [facility] [phone] - pending COVID results per Ronita Hipps, [facility] AC. Windy Kalata, RN, advised.

## 2019-08-23 NOTE — ED Notes (Addendum)
When this RN approached pt with her iron tab & PRN haldol pt was cooperative when the iron was explained. The pt began to have an increased ire when haldol was offered, she was yelling "you coming at me, I have no mental health," "Those papers you have are fake."

## 2019-08-23 NOTE — BH Assessment (Signed)
Comprehensive Clinical Assessment (CCA) Note  08/23/2019 Jessica Foster [id number]  Visit Diagnosis:      ICD-10-CM   1. Involuntary commitment  Z04.6   2. Medical clearance for psychiatric admission  Z00.8      Per EDP, "Jessica Foster is a 34 y.o. female who presents to the ED under IVC.  Per IVC paperwork: The respondent is talking to the devil. The spirit told her to hurt her guardian with a knife. She believes she must do what the spirit realm tell her to. She is occupying the front porch of [place] [employer].  Pt is denying any of these allegations. She reports the individual who placed the IVC paperwork is lying and is actually the one who is talking to the devil. Pt denies SI, HI, or AVH.  The history is provided by the patient, the police and medical records. "   During assessment pt presented very irritated , agitated and upset. Pt states , " yal keep playing this game with me, for somebody to worship the devil". Pt states [employer] brought her here because a lady she does not like, who is also supposedly her guardian called [employer] on her. Pt provides limited history and denies everything. Pt denies SI, HI, AVH and self harm. Pt denies previous SI attempts, no history of trauma/abuse, family mental health. Pt denies that she has any mental health symptoms, states there is nothing wrong with her. Pt reports she is currently homeless and unemployed. Pt claims the lady who brought her here is a child molester. Patient states she believes that she is being detained against her will and that no one has the right to keep her.  Per EVO:Jessica Foster is talking to the devil, The spirit told her to hurt her guardian with a knife, she believes she must do what the spirit realm tells her to. She is occupying the front porch of [place] [employer].  Collateral:  TTS spoke with pts legal guardian Zenovia Jarred at [phone]. She states pt has a history of schizophrenia and has been off her  medications for over a year and has not seen a provider since January 2021 due to her refusing to see anyone. She states pt was last seen at [facility] and that she is currently homeless and refuses to get help. She states pt is not a harm to self but has threatened her a few times to kill her to get " bad spirits out". She states pt needs assistance with med management, therapy but pt simply refuses all services this has been ongoing issue for past year. Diagnosis: per history: Schizophrenia  Disposition: [name], FNP recommends pt for overnight observation, reassess in the morning.  CCA Screening, Triage and Referral (STR)  Patient Reported Information How did you hear about [place]? Legal System  Referral name: Ward Chatters Referral phone number:  [phone]  Whom do you see for routine medical problems? I don't have a doctor  Practice/Facility Name: No data recorded Practice/Facility Phone Number: No data recorded Name of Contact: No data recorded Contact Number: No data recorded Contact Fax Number: No data recorded Prescriber Name: No data recorded Prescriber Address (if known): No data recorded  What Is the Reason for Your Visit/Call Today? IVC How Long Has This Been Causing You Problems? <Week  What Do You Feel Would Help You the Most Today? Assessment Only   Have You Recently Been in Any Inpatient Treatment (Hospital/Detox/Crisis Center/28-Day Program)? No  Name/Location of  Program/Hospital:No data recorded How Long Were You There? No data recorded When Were You Discharged? No data recorded  [facility]? Yes  [facility]? No data recorded  Have You Recently Had Any Thoughts About Hurting Yourself? No  Are You Planning to Commit Suicide/Harm Yourself At This time? No   Have you Recently Had Thoughts About Hurting Someone [name]? No  Explanation: No data recorded  Have You Used Any Alcohol or  Drugs in the Past 24 Hours? No  How Long Ago Did You Use Drugs or Alcohol? No data recorded What Did You Use and How Much? No data recorded  Do You Currently Have a Therapist/Psychiatrist? No  Name of Therapist/Psychiatrist: NONE  Have You Been Recently Discharged From Any Office Practice or Programs? No  Explanation of Discharge From Practice/Program: No data recorded    CCA Screening Triage Referral Assessment Type of Contact: Tele-Assessment  Is this Initial or Reassessment? Initial Assessment  Date Telepsych consult ordered in [facility]:  08/22/19  Time [facility]psych [facility]:  2355   Patient Reported Information Reviewed? Yes  Patient Left Without Being Seen? No data recorded Reason for Not Completing Assessment: No data recorded  Collateral Involvement: Guardian  Does Patient Have a Court Appointed Legal Guardian? No data recorded Name and Contact of Legal Guardian: No data recorded If Minor and Not Living with Parent(s), Who has Custody? No data recorded Is CPS involved or ever been involved? Never  Is APS involved or ever been involved? Never   Patient Determined To Be At Risk for Harm To Self or Others Based on Review of Patient Reported Information or Presenting Complaint? No  Method: No data recorded Availability of Means: No data recorded Intent: No data recorded Notification Required: No data recorded Additional Information for Danger to Others Potential: No data recorded Additional Comments for Danger to Others Potential: No data recorded Are There Guns or Other Weapons in Your Home? No data recorded Types of Guns/Weapons: No data recorded Are These Weapons Safely Secured?                            No data recorded Who Could Verify You Are Able To Have These Secured: No data recorded Do You Have any Outstanding Charges, Pending Court Dates, Parole/Probation? No data recorded Contacted To Inform of Risk of Harm To Self or Others: Law  Enforcement   [facility]   Does Patient Present under Involuntary Commitment? Yes  IVC Papers Initial File Date: 08/22/19 (08/22/19)   County of Residence: [place]   Patient Currently Receiving the Following Services: Not Receiving Services   Determination of Need: Urgent (48 hours)   Options For Referral: Other: Comment     CCA Biopsychosocial  Intake/Chief Complaint:  CCA Intake With Chief Complaint CCA Part Two Date: 08/22/19 CCA Part Two Time: 2343  Mental Health Symptoms Depression:  Depression:  (Pt denies everything)  Mania:  Mania:  (pt denied any symtpoms)  Anxiety:   Anxiety:  (pt denied any symtpoms)  Psychosis:  Psychosis: Grossly disorganized or catatonic behavior, Delusions, Duration of symptoms greater than six months  Trauma:  Trauma: None (pt denied any symtpoms)  Obsessions:     Compulsions:     Inattention:  Inattention: None  Hyperactivity/Impulsivity:  Hyperactivity/Impulsivity: N/A  Oppositional/Defiant Behaviors:  Oppositional/Defiant Behaviors: None  Emotional Irregularity:     Other  Mood/Personality Symptoms:      Mental Status Exam Appearance and self-care  Stature:  Stature: Average  Weight:  Weight: Average weight  Clothing:  Clothing: Disheveled  Grooming:  Grooming: Neglected  Cosmetic use:  Cosmetic Use: Age appropriate  Posture/gait:  Posture/Gait: Normal  Motor activity:  Motor Activity:  (Normal)  Sensorium  Attention:  Attention: Normal  Concentration:  Concentration: Focuses on irrelevancies  Orientation:  Orientation: Object, Person, Place, Situation, Time  Recall/memory:  Recall/Memory: Normal  Affect and Mood  Affect:  Affect: Labile, Other (Comment) (Irritable)  Mood:  Mood: Irritable  Relating  Eye contact:  Eye Contact: Normal  Facial expression:  Facial Expression: Angry  Attitude toward examiner:  Attitude Toward Examiner: Dramatic  Thought and Language  Speech flow: Speech Flow: Loud,  Clear and Coherent  Thought content:  Thought Content: Ideas of Reference, Appropriate to Mood and Circumstances  Preoccupation:  Preoccupations: Religion  Hallucinations:  Hallucinations: None (pt denies)  Organization:     Company secretary of Knowledge:  Fund of Knowledge: Fair  Intelligence:  Intelligence: Average  Abstraction:  Abstraction: Normal  Judgement:  Judgement: Fair  Dance movement psychotherapist:  Reality Testing: Variable  Insight:  Insight: Denial  Decision Making:     Social Functioning  Social Maturity:     Social Judgement:  Social Judgement: Victimized  Stress  Stressors:  Stressors: Other (Comment)  Coping Ability:  Coping Ability: Normal  Skill Deficits:  Skill Deficits: Self-care  Supports:  Supports: Support needed     Religion: Religion/Spirituality Are You A Religious Person?:  Industrial/product designer)  Leisure/Recreation: Leisure / Recreation Do You Have Hobbies?:  (UTA)  Exercise/Diet: Exercise/Diet Do You Exercise?:  (UTA) Have You Gained or Lost A Significant Amount of Weight in the Past Six Months?:  (UTA) Do You Follow a Special Diet?:  (UTA) Do You Have Any Trouble Sleeping?:  (UTA)   CCA Employment/Education  Employment/Work Situation: Employment / Work Situation Employment situation: Unemployed 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) Has patient ever been in the [employer]?: No  Education: Education Is Patient Currently Attending School?: No Last Grade Completed: 12 (12) Name of High School:  ([employer]) Did Garment/textile technologist From [employer]?: No Did You Product manager?: Yes What Type of College Degree Do you Have?:  ([employer]) Did You Attend Graduate School?: No Did You Have An Individualized Education Program (IIEP): No Did You Have Any Difficulty At School?: No Patient's Education Has Been Impacted by Current Illness: No   CCA Family/Childhood History  Family and Relationship History: Family history Marital status: Single Are you  sexually active?:  (UTA) What is your sexual orientation?:  (UTA) Has your sexual activity been affected by drugs, alcohol, medication, or emotional stress?:  (UTA) Does patient have children?: No  Childhood History:  Childhood History Additional childhood history information:  (UTA) Description of patient's relationship with caregiver when they were a child:  (UTA) Patient's description of current relationship with people who raised him/her:  (UTA) How were you disciplined when you got in trouble as a child/adolescent?:  (UTA) Does patient have siblings?: No Did patient suffer any verbal/emotional/physical/sexual abuse as a child?: No Did patient suffer from severe childhood neglect?: No Has patient ever been sexually abused/assaulted/raped as an adolescent or adult?: No Was the patient ever a victim of a crime or a disaster?: No Witnessed domestic violence?: No Has patient been affected by domestic violence as an adult?: No  Child/Adolescent Assessment:  CCA Substance Use  Alcohol/Drug Use: Alcohol / Drug Use Pain Medications:  (see MAR) History of alcohol / drug use?: No history of alcohol / drug abuse     Substance use Disorder (SUD) Substance Use Disorder (SUD)  Checklist Symptoms of Substance Use:  (NA)  Recommendations for Services/Supports/Treatments: Recommendations for Services/Supports/Treatments Recommendations For Services/Supports/Treatments: Other (Comment)  DSM5 Diagnoses: There are no problems to display for this patient.    Natasha Mead, LCSWA

## 2019-08-23 NOTE — Progress Notes (Addendum)
Patient was brought onto the unit by night [facility]. She took her to her room to get some rest. Writer informed Veronique RN that she was on the unit and her admission would need to be done. Leeann Must informed writer that she completed the skin assessment. Patient was admitted by Delorse Limber, RN. She went to her room to eat her salad. Safety maintained with 15 min checks.

## 2019-08-23 NOTE — Consult Note (Signed)
  Jessica Foster, 34 y.o., female patient seen via tele psych by this provider, consulted with Dr. Jola Babinski; and chart reviewed on 08/23/19.  On evaluation Jessica Foster is in bed and states "[facility] ar you looking at me like you're in a cult.  What is your problem."  Patient asked what brought her to the hospital.  "Police brought me here against my will.  I called the police and they manipulated me with witch craft.  I called them but they were crooked cops and know very well that nothing is wrong with me.  I want to talk to a doctor."  Patient states that she is not taking any medications and "I'm not on medications for mental health and I'm not going to take any kind of medication.  You are all in a cult I'm calling the police.  The christian police." During evaluation Jessica Foster is alert/oriented to person and place; she is calm/cooperative during this assessment but becoming irritated as the questioning goes on feeling that she is surrounded by cult members.  She does appear to be having delusional thoughts, and paranoia; although she denies suicidal/self-harm/homicidal ideation, psychosis, and paranoia.  Patient answered question appropriately.     Disposition: Recommend psychiatric Inpatient admission when medically cleared.   EKG 08/22/19; No noted prolonged QTc Started Zyprexa Zydis 5 mg Q hs for psychosis

## 2019-08-23 NOTE — Progress Notes (Signed)
Patient ID: Jessica Foster, female   DOB: 1985-06-20, 34 y.o.   MRN: [id number] Patient presents involuntarily secondary to disorganized thinking and behaviors. Patient's guardian reported that patient believes that the spirits are commending her and she must do whatever they say. Patient reports that she has been living on the street "but I am OK, I am protected". Patient reports that her guardian made up stories in order to have her admitted to the hospital "she is not even my real guardian, we are the same age, she can't be my guardian...". Patient reports that there is nothing wrong with her "I just need to talk to the doctor, then go home". Patient is cooperative with assessment, denying thoughts of self harm. She is guarded and brief. She is disheveled with poor hygiene. Skin assessment completed. Presents with non pitting bilateral edema on lower legs. Denies medical concerns. Patient was oriented to the unit, received a dinner and went to bed reporting that she is feeling sleepy. Safety precautions initiated.

## 2019-08-23 NOTE — Tx Team (Signed)
Initial Treatment Plan 08/23/2019 10:48 PM Jessica Foster [id number]    PATIENT STRESSORS: Financial difficulties Loss of housing   PATIENT STRENGTHS: Average or above average intelligence Communication skills Physical Health   PATIENT IDENTIFIED PROBLEMS: Disturbed thought process  Homelessness  Self-care deficit                 DISCHARGE CRITERIA:  Adequate post-discharge living arrangements Improved stabilization in mood, thinking, and/or behavior Motivation to continue treatment in a less acute level of care Safe-care adequate arrangements made  PRELIMINARY DISCHARGE PLAN: Attend aftercare/continuing care group Outpatient therapy Placement in alternative living arrangements  PATIENT/FAMILY INVOLVEMENT: This treatment plan has been presented to and reviewed with the patient, Jessica Foster.  The patient has been given the opportunity to ask questions and make suggestions.  Olin Pia, RN 08/23/2019, 10:48 PM

## 2019-08-23 NOTE — ED Notes (Signed)
Pt was speaking very loud to the sitter claiming "you told me the doctor would be here at 8 o'clock, where he at?!" stating "Ya'll can't hold me against my will," and "them papers are fake." Pt went back to her bed & laid down, the sitter remains at the door, watching the pt for safety.

## 2019-08-23 NOTE — Plan of Care (Signed)
Newly admitted. Cooperative on the unit and denying thoughts of self harm. Denies hallucinations. Reported that she is feeling tired and went to bed. Currently resting and safety maintained.

## 2019-08-24 LAB — IRON AND TIBC
Iron: 35 ug/dL (ref 28–170)
Saturation Ratios: 9 % — ABNORMAL LOW (ref 10.4–31.8)
TIBC: 396 ug/dL (ref 250–450)
UIBC: 361 ug/dL

## 2019-08-24 LAB — HEMOGLOBIN A1C
Hgb A1c MFr Bld: 5.6 % (ref 4.8–5.6)
Mean Plasma Glucose: 114.02 mg/dL

## 2019-08-24 LAB — LIPID PANEL
Cholesterol: 145 mg/dL (ref 0–200)
HDL: 46 mg/dL (ref >40)
LDL Cholesterol: 89 mg/dL (ref 0–99)
Total CHOL/HDL Ratio: 3.2 RATIO
Triglycerides: 49 mg/dL (ref <150)
VLDL: 10 mg/dL (ref 0–40)

## 2019-08-24 LAB — TSH: TSH: 3.279 u[IU]/mL (ref 0.350–4.500)

## 2019-08-24 LAB — RETICULOCYTES
Immature Retic Fract: 9.8 % (ref 2.3–15.9)
RBC.: 4.41 MIL/uL (ref 3.87–5.11)
Retic Count, Absolute: 48.5 10*3/uL (ref 19.0–186.0)
Retic Ct Pct: 1.1 % (ref 0.4–3.1)

## 2019-08-24 LAB — VITAMIN B12: Vitamin B-12: 594 pg/mL (ref 180–914)

## 2019-08-24 LAB — FERRITIN: Ferritin: 15 ng/mL (ref 11–307)

## 2019-08-24 LAB — FOLATE: Folate: 5.1 ng/mL — ABNORMAL LOW (ref >5.9)

## 2019-08-24 MED ORDER — HYDROCHLOROTHIAZIDE 12.5 MG PO CAPS
12.5000 mg | ORAL_CAPSULE | Freq: Every day | ORAL | Status: DC
  Administered 2019-08-24 – 2019-08-28 (×5): 12.5 mg via ORAL
  Filled 2019-08-24 (×8): qty 1

## 2019-08-24 MED ORDER — POTASSIUM CHLORIDE CRYS ER 20 MEQ PO TBCR
20.0000 meq | EXTENDED_RELEASE_TABLET | Freq: Every day | ORAL | Status: DC
  Administered 2019-08-24 – 2019-08-26 (×3): 20 meq via ORAL
  Filled 2019-08-24 (×5): qty 1

## 2019-08-24 NOTE — Progress Notes (Signed)
Patient's belongings locked up in two cabinets by [facility] and Security Jonny Ruiz).  To be inventoried by unit staff when patient awakens.

## 2019-08-24 NOTE — H&P (Signed)
Psychiatric Admission Assessment Adult  Patient Identification: Jessica Foster MRN:  [id number] Date of Evaluation:  08/24/2019 Chief Complaint:  Schizophrenia (HCC) [F20.9] Principal Diagnosis: <principal problem not specified> Diagnosis:  Active Problems:   Schizophrenia (HCC)  History of Present Illness: Patient is seen and examined.  Patient is a 34 year old female who originally presented to the [facility] emergency department on 08/22/2019 under involuntary commitment.  The patient stated that someone is pretending to be her guardian, and it be involuntarily committed her.  She stated she is unsure why, but believes that "the devil is involved with this".  She stated that the person who claims to be her guardian is a family member, but "she is not my guardian".  Patient stated that currently she is living on the street.  She denied any family history of any psychiatric illness.  She denied any personal history of psychiatric illness.  Review of the electronic medical record revealed an evaluation at the behavioral health hospital back on 12/30/2018.  She had presented at that time under involuntary commitment secondary to paranoia.  At that time she stated that her church family is "a cult".  She was evaluated and did not feel needed to be admitted to the hospital emergently.  She did not fulfill criteria for involuntary commitment.  She refused outpatient resources and was discharged.  On evaluation today she denied any suicidal or homicidal ideation.  She does appear to be paranoid and had some delusions.  She denied any suicidal or homicidal ideation.  Collateral information obtained from the emergency department revealed that the patient's legal guardian was contacted.  The guardian stated that the patient had a history of schizophrenia and had been off her medications for over a year.  She had not seen a psychiatrist since January 2021 due to her refusing to see anyone.  She was  last seen at [facility], and is currently homeless and refuses to get help.  Regarding stated the patient was not a harm to herself but threatened her a few times to "get the bad spirits out".  She was started on Zyprexa 5 mg p.o. nightly to the emergency room.  She was admitted to the hospital for evaluation and stabilization.  Associated Signs/Symptoms: Depression Symptoms:  insomnia, psychomotor agitation, fatigue, anxiety, loss of energy/fatigue, disturbed sleep, (Hypo) Manic Symptoms:  Hallucinations, Impulsivity, Irritable Mood, Labiality of Mood, Anxiety Symptoms:  Excessive Worry, Psychotic Symptoms:  Delusions, Hallucinations: Auditory Paranoia, PTSD Symptoms: Negative Total Time spent with patient: 45 minutes  Past Psychiatric History: Patient denied any history of psychiatric illness, but notes from her family members reported that she had previously been psychiatrically hospitalized and was treated for schizophrenia but had refused follow-up care.  Is the patient at risk to self? Yes.    Has the patient been a risk to self in the past 6 months? Yes.    Has the patient been a risk to self within the distant past? No.  Is the patient a risk to others? Yes.    Has the patient been a risk to others in the past 6 months? Yes.    Has the patient been a risk to others within the distant past? No.   Prior Inpatient Therapy:   Prior Outpatient Therapy:    Alcohol Screening: 1. How often do you have a drink containing alcohol?: Never 2. How many drinks containing alcohol do you have on a typical day when you are drinking?: 1 or 2 3. How often  do you have six or more drinks on one occasion?: Never AUDIT-C Score: 0 4. How often during the last year have you found that you were not able to stop drinking once you had started?: Never 5. How often during the last year have you failed to do what was normally expected from you because of drinking?: Never 6. How often during the last  year have you needed a first drink in the morning to get yourself going after a heavy drinking session?: Never 7. How often during the last year have you had a feeling of guilt of remorse after drinking?: Never 8. How often during the last year have you been unable to remember what happened the night before because you had been drinking?: Never 9. Have you or someone else been injured as a result of your drinking?: No 10. Has a relative or friend or a doctor or another health worker been concerned about your drinking or suggested you cut down?: No Alcohol Use Disorder Identification Test Final Score (AUDIT): 0 Alcohol Brief Interventions/Follow-up: Continued Monitoring Substance Abuse History in the last 12 months:  No. Consequences of Substance Abuse: Negative Previous Psychotropic Medications: Yes  Psychological Evaluations: Yes  Past Medical History:  Past Medical History:  Diagnosis Date  . Obesity    History reviewed. No pertinent surgical history. Family History: History reviewed. No pertinent family history. Family Psychiatric  History: Noncontributory Tobacco Screening: Have you used any form of tobacco in the last 30 days? (Cigarettes, Smokeless Tobacco, Cigars, and/or Pipes): Yes Tobacco use, Select all that apply: 5 or more cigarettes per day Are you interested in Tobacco Cessation Medications?: No, patient refused Counseled patient on smoking cessation including recognizing danger situations, developing coping skills and basic information about quitting provided: Refused/Declined practical counseling Social History:  Social History   Substance and Sexual Activity  Alcohol Use No     Social History   Substance and Sexual Activity  Drug Use No    Additional Social History:                           Allergies:  No Known Allergies Lab Results:  Results for orders placed or performed during the hospital encounter of 08/23/19 (from the past 48 hour(s))   Hemoglobin A1c     Status: None   Collection Time: 08/24/19  7:13 AM  Result Value Ref Range   Hgb A1c MFr Bld 5.6 4.8 - 5.6 %    Comment: (NOTE) Pre diabetes:          5.7%-6.4%  Diabetes:              >6.4%  Glycemic control for   <7.0% adults with diabetes    Mean Plasma Glucose 114.02 mg/dL    Comment: Performed at [facility], [address], [place], [place] [address]  Lipid panel     Status: None   Collection Time: 08/24/19  7:13 AM  Result Value Ref Range   Cholesterol 145 0 - 200 mg/dL   Triglycerides 49 <803 mg/dL   HDL 46 >21 mg/dL   Total CHOL/HDL Ratio 3.2 RATIO   VLDL 10 0 - 40 mg/dL   LDL Cholesterol 89 0 - 99 mg/dL    Comment:        Total Cholesterol/HDL:CHD Risk Coronary Heart Disease Risk Table  Men   Women  1/2 Average Risk   3.4   3.3  Average Risk       5.0   4.4  2 X Average Risk   9.6   7.1  3 X Average Risk  23.4   11.0        Use the calculated Patient Ratio above and the CHD Risk Table to determine the patient's CHD Risk.        ATP III CLASSIFICATION (LDL):  <100     mg/dL   Optimal  [id number]  mg/dL   Near or Above                    Optimal  130-159  mg/dL   Borderline  [id number]  mg/dL   High  >811>190     mg/dL   Very High Performed at [facility], [address] [place], [place] [address]   TSH     Status: None   Collection Time: 08/24/19  7:13 AM  Result Value Ref Range   TSH 3.279 0.350 - 4.500 uIU/mL    Comment: Performed by a 3rd Generation assay with a functional sensitivity of <=0.01 uIU/mL. Performed at [facility], [address] [place], [place] [address]     Blood Alcohol level:  Lab Results  Component Value Date   ETH <10 08/22/2019    Metabolic Disorder Labs:  Lab Results  Component Value Date   HGBA1C 5.6 08/24/2019   MPG 114.02 08/24/2019   No results found for: PROLACTIN Lab Results  Component Value Date   CHOL 145 08/24/2019   TRIG 49  08/24/2019   HDL 46 08/24/2019   CHOLHDL 3.2 08/24/2019   VLDL 10 08/24/2019   LDLCALC 89 08/24/2019    Current Medications: Current Facility-Administered Medications  Medication Dose Route Frequency Provider Last Rate Last Admin  . acetaminophen (TYLENOL) tablet 650 mg  650 mg Oral Q6H PRN Nira ConnBerry, Jason A, NP      . alum & mag hydroxide-simeth (MAALOX/MYLANTA) 200-200-20 MG/5ML suspension 30 mL  30 mL Oral Q4H PRN Nira ConnBerry, Jason A, NP      . hydrochlorothiazide (MICROZIDE) capsule 12.5 mg  12.5 mg Oral Daily Antonieta Pertlary, Andrick Rust Lawson, MD   12.5 mg at 08/24/19 1131  . hydrOXYzine (ATARAX/VISTARIL) tablet 25 mg  25 mg Oral TID PRN Nira ConnBerry, Jason A, NP      . ziprasidone (GEODON) injection 10 mg  10 mg Intramuscular Q12H PRN Jackelyn PolingBerry, Jason A, NP       And  . LORazepam (ATIVAN) tablet 1 mg  1 mg Oral PRN Nira ConnBerry, Jason A, NP      . magnesium hydroxide (MILK OF MAGNESIA) suspension 30 mL  30 mL Oral Daily PRN Nira ConnBerry, Jason A, NP      . OLANZapine zydis (ZYPREXA) disintegrating tablet 5 mg  5 mg Oral QHS Nira ConnBerry, Jason A, NP      . potassium chloride SA (KLOR-CON) CR tablet 20 mEq  20 mEq Oral Daily Antonieta Pertlary, Dasha Kawabata Lawson, MD   20 mEq at 08/24/19 1131  . traZODone (DESYREL) tablet 50 mg  50 mg Oral QHS PRN Jackelyn PolingBerry, Jason A, NP       PTA Medications: Medications Prior to Admission  Medication Sig Dispense Refill Last Dose  . ferrous sulfate 325 (65 FE) MG tablet Take 1 tablet (325 mg total) by mouth daily. (Patient not taking: Reported on 08/23/2019) 30 tablet 0     Musculoskeletal: Strength &  Muscle Tone: within normal limits Gait & Station: normal Patient leans: N/A  Psychiatric Specialty Exam: Physical Exam Vitals and nursing note reviewed.  HENT:     Head: Normocephalic and atraumatic.  Pulmonary:     Effort: Pulmonary effort is normal.  Neurological:     General: No focal deficit present.     Mental Status: She is alert and oriented to person, place, and time.     Review of Systems  Blood pressure  (!) 155/91, pulse 97, temperature 97.9 F (36.6 C), temperature source Oral, resp. rate 20, height 5\' 2"  (1.575 m), weight (!) 140.6 kg, last menstrual period 08/16/2019, SpO2 98 %.Body mass index is 56.69 kg/m.  General Appearance: Disheveled  Eye Contact:  Fair  Speech:  Normal Rate  Volume:  Increased  Mood:  Dysphoric and Irritable  Affect:  Congruent  Thought Process:  Coherent and Descriptions of Associations: Loose  Orientation:  Full (Time, Place, and Person)  Thought Content:  Delusions and Paranoid Ideation  Suicidal Thoughts:  No  Homicidal Thoughts:  No  Memory:  Immediate;   Poor Recent;   Poor Remote;   Poor  Judgement:  Impaired  Insight:  Lacking  Psychomotor Activity:  Increased  Concentration:  Concentration: Fair and Attention Span: Fair  Recall:  Poor  Fund of Knowledge:  Fair  Language:  Fair  Akathisia:  Negative  Handed:  Right  AIMS (if indicated):     Assets:  Desire for Improvement Housing Resilience  ADL's:  Intact  Cognition:  WNL  Sleep:       Treatment Plan Summary: Daily contact with patient to assess and evaluate symptoms and progress in treatment, Medication management and Plan : Patient is seen and examined.  Patient is a 34 year old female with a reported past psychiatric history as per above.  She will be admitted to the hospital.  She will be integrated in the milieu.  She will be encouraged to attend groups.  She has been started on olanzapine Zydis 5 mg p.o. nightly last night.  Hopefully she will be compliant with that.  Right now she denies any symptoms, but clearly is paranoid.  She denied any suicidal or homicidal ideation.  Hopefully we can convince her to continue this medication and get her back to [facility].  Review of her admission laboratories revealed a mildly low potassium at 3.4.  This will be supplemented.  Her blood sugar was slightly elevated at 119.  Liver function enzymes were normal.  Lipid panel was normal.  She her CBC  shows she does have a mild anemia with a hemoglobin of 9.9 and hematocrit of 33.2.  Her MCH is also decreased at 24.6 and her MCHC is down to 29.8.  We will send off an anemia panel.  Platelets were normal at 301,000.  Her acetaminophen was less than 10, salicylate was less than 7.  Beta-hCG was less than 5.  TSH was 3.279.  Blood alcohol was less than 10, salicylate was less than 7.  Drug screen was negative.  EKG showed a sinus tachycardia with a normal QTc interval.  Her blood pressure is elevated 155/85 and 155/91.  Her pulse is 97 she is afebrile.  Sleep was not recorded in the chart.  I was unable to find any previous medications in the chart for hypertension.  We will start hydrochlorothiazide 12.5 mg p.o. daily and titrate this during the course of hospitalization.  Observation Level/Precautions:  15 minute checks  Laboratory:  Chemistry Profile  Psychotherapy:    Medications:    Consultations:    Discharge Concerns:    Estimated LOS:  Other:     Physician Treatment Plan for Primary Diagnosis: <principal problem not specified> Long Term Goal(s): Improvement in symptoms so as ready for discharge  Short Term Goals: Ability to identify changes in lifestyle to reduce recurrence of condition will improve, Ability to verbalize feelings will improve, Ability to demonstrate self-control will improve, Ability to identify and develop effective coping behaviors will improve, Ability to maintain clinical measurements within normal limits will improve and Compliance with prescribed medications will improve  Physician Treatment Plan for Secondary Diagnosis: Active Problems:   Schizophrenia (HCC)  Long Term Goal(s): Improvement in symptoms so as ready for discharge  Short Term Goals: Ability to identify changes in lifestyle to reduce recurrence of condition will improve, Ability to verbalize feelings will improve, Ability to demonstrate self-control will improve, Ability to identify and develop  effective coping behaviors will improve, Ability to maintain clinical measurements within normal limits will improve and Compliance with prescribed medications will improve  I certify that inpatient services furnished can reasonably be expected to improve the patient's condition.    Antonieta Pert, MD 7/18/20212:44 PM

## 2019-08-24 NOTE — Plan of Care (Signed)
Progress note  D: pt found in bed; compliant with morning assessment. Pt continues to express concern with being here "against their will" and that their guardian isn't really their guardian. "She is a family member, and she is projecting the symptoms they have onto me." pt also express concern with having to wait, as they feel they should have been discharged already. Pt denies any physical complaints or pain. Pt does present irritable and anxious though. Pt is disheveled with poor hygiene and still in scrubs. Pt is pleasant and redirectable with explanation though. Pt continues to be isolative to their room. Pt denies si/hi/ah/vh and verbally agrees to approach staff if these become apparent or before harming themself/others while at [facility].  A: Pt provided support and encouragement. Pt given medication per protocol and standing orders. Q79m safety checks implemented and continued.  R: Pt safe on the unit. Will continue to monitor.  Pt progressing in the following metrics  Problem: Education: Goal: Knowledge of [facility] Education information/materials will improve Outcome: Progressing Goal: Verbalization of understanding the information provided will improve Outcome: Progressing   Problem: Education: Goal: Ability to state activities that reduce stress will improve Outcome: Progressing   Problem: Coping: Goal: Ability to identify and develop effective coping behavior will improve Outcome: Progressing

## 2019-08-24 NOTE — [facility] Group Notes (Signed)
[facility] LCSW Group Therapy Note  Date/Time:  08/24/2019  11:00AM-12:00PM  Type of Therapy and Topic:  Group Therapy:  Music and Mood  Participation Level:  Did Not Attend   Description of Group: In this process group, members listened to a variety of genres of music and identified that different types of music evoke different responses.  Patients were encouraged to identify music that was soothing for them and music that was energizing for them.  Patients discussed how this knowledge can help with wellness and recovery in various ways including managing depression and anxiety as well as encouraging healthy sleep habits.    Therapeutic Goals: 1. Patients will explore the impact of different varieties of music on mood 2. Patients will verbalize the thoughts they have when listening to different types of music 3. Patients will identify music that is soothing to them as well as music that is energizing to them 4. Patients will discuss how to use this knowledge to assist in maintaining wellness and recovery 5. Patients will explore the use of music as a coping skill  Summary of Patient Progress: Did not attend   Therapeutic Modalities: Solution Focused Brief Therapy Activity   Ambrose Mantle, LCSW

## 2019-08-24 NOTE — [facility] Suicide Risk Assessment (Signed)
[facility] Admission Suicide Risk Assessment   Nursing information obtained from:  Patient Demographic factors:  Low socioeconomic status, Unemployed Current Mental Status:  NA Loss Factors:  Financial problems / change in socioeconomic status Historical Factors:  NA Risk Reduction Factors:  NA  Total Time spent with patient: 45 minutes Principal Problem: <principal problem not specified> Diagnosis:  Active Problems:   Schizophrenia (HCC)  Subjective Data: Patient is seen and examined.  Patient is a 34 year old female who originally presented to the [facility] emergency department on 08/22/2019 under involuntary commitment.  The patient stated that someone is pretending to be her guardian, and it be involuntarily committed her.  She stated she is unsure why, but believes that "the devil is involved with this".  She stated that the person who claims to be her guardian is a family member, but "she is not my guardian".  Patient stated that currently she is living on the street.  She denied any family history of any psychiatric illness.  She denied any personal history of psychiatric illness.  Review of the electronic medical record revealed an evaluation at the behavioral health hospital back on 12/30/2018.  She had presented at that time under involuntary commitment secondary to paranoia.  At that time she stated that her church family is "a cult".  She was evaluated and did not feel needed to be admitted to the hospital emergently.  She did not fulfill criteria for involuntary commitment.  She refused outpatient resources and was discharged.  On evaluation today she denied any suicidal or homicidal ideation.  She does appear to be paranoid and had some delusions.  She denied any suicidal or homicidal ideation.  Collateral information obtained from the emergency department revealed that the patient's legal guardian was contacted.  The guardian stated that the patient had a history of  schizophrenia and had been off her medications for over a year.  She had not seen a psychiatrist since January 2021 due to her refusing to see anyone.  She was last seen at [facility], and is currently homeless and refuses to get help.  Regarding stated the patient was not a harm to herself but threatened her a few times to "get the bad spirits out".  She was started on Zyprexa 5 mg p.o. nightly to the emergency room.  She was admitted to the hospital for evaluation and stabilization.  Continued Clinical Symptoms:  Alcohol Use Disorder Identification Test Final Score (AUDIT): 0 The "Alcohol Use Disorders Identification Test", Guidelines for Use in Primary Care, Second Edition.  World Science writer [facility]). Score between 0-7:  no or low risk or alcohol related problems. Score between 8-15:  moderate risk of alcohol related problems. Score between 16-19:  high risk of alcohol related problems. Score 20 or above:  warrants further diagnostic evaluation for alcohol dependence and treatment.   CLINICAL FACTORS:   Schizophrenia:   Less than 37 years old Paranoid or undifferentiated type   Musculoskeletal: Strength & Muscle Tone: within normal limits Gait & Station: normal Patient leans: N/A  Psychiatric Specialty Exam: Physical Exam Vitals and nursing note reviewed.  Constitutional:      Appearance: Normal appearance.  HENT:     Head: Normocephalic and atraumatic.  Pulmonary:     Effort: Pulmonary effort is normal.  Neurological:     General: No focal deficit present.     Mental Status: She is alert and oriented to person, place, and time.     Review of Systems  Blood pressure (!) 155/91, pulse 97, temperature 97.9 F (36.6 C), temperature source Oral, resp. rate 20, height 5\' 2"  (1.575 m), weight (!) 140.6 kg, last menstrual period 08/16/2019, SpO2 98 %.Body mass index is 56.69 kg/m.  General Appearance: Disheveled  Eye Contact:  Fair  Speech:  Normal Rate  Volume:  Increased   Mood:  Dysphoric and Irritable  Affect:  Congruent  Thought Process:  Goal Directed and Descriptions of Associations: Circumstantial  Orientation:  Full (Time, Place, and Person)  Thought Content:  Paranoid Ideation  Suicidal Thoughts:  No  Homicidal Thoughts:  No  Memory:  Immediate;   Poor Recent;   Poor Remote;   Poor  Judgement:  Impaired  Insight:  Lacking  Psychomotor Activity:  Increased  Concentration:  Concentration: Fair and Attention Span: Fair  Recall:  10/17/2019 of Knowledge:  Fair  Language:  Fair  Akathisia:  Negative  Handed:  Right  AIMS (if indicated):     Assets:  Desire for Improvement Resilience  ADL's:  Intact  Cognition:  WNL  Sleep:         COGNITIVE FEATURES THAT CONTRIBUTE TO RISK:  Thought constriction (tunnel vision)    SUICIDE RISK:   Mild:  Suicidal ideation of limited frequency, intensity, duration, and specificity.  There are no identifiable plans, no associated intent, mild dysphoria and related symptoms, good self-control (both objective and subjective assessment), few other risk factors, and identifiable protective factors, including available and accessible social support.  PLAN OF CARE: Patient is seen and examined.  Patient is a 34 year old female with a reported past psychiatric history as per above.  She will be admitted to the hospital.  She will be integrated in the milieu.  She will be encouraged to attend groups.  She has been started on olanzapine Zydis 5 mg p.o. nightly last night.  Hopefully she will be compliant with that.  Right now she denies any symptoms, but clearly is paranoid.  She denied any suicidal or homicidal ideation.  Hopefully we can convince her to continue this medication and get her back to [facility].  Review of her admission laboratories revealed a mildly low potassium at 3.4.  This will be supplemented.  Her blood sugar was slightly elevated at 119.  Liver function enzymes were normal.  Lipid panel was normal.  She  her CBC shows she does have a mild anemia with a hemoglobin of 9.9 and hematocrit of 33.2.  Her MCH is also decreased at 24.6 and her MCHC is down to 29.8.  We will send off an anemia panel.  Platelets were normal at 301,000.  Her acetaminophen was less than 10, salicylate was less than 7.  Beta-hCG was less than 5.  TSH was 3.279.  Blood alcohol was less than 10, salicylate was less than 7.  Drug screen was negative.  EKG showed a sinus tachycardia with a normal QTc interval.  Her blood pressure is elevated 155/85 and 155/91.  Her pulse is 97 she is afebrile.  Sleep was not recorded in the chart.  I was unable to find any previous medications in the chart for hypertension.  We will start hydrochlorothiazide 12.5 mg p.o. daily and titrate this during the course of hospitalization.  I certify that inpatient services furnished can reasonably be expected to improve the patient's condition.   [facility], MD 08/24/2019, 10:34 AM

## 2019-08-24 NOTE — Progress Notes (Signed)
Patient has been in her room resting mostly but not asleep at beginning of shift. Writer spoke with her 1:1 and she is concerned about her diagnosis reporting that she is not schizophrenic and wants to know what determined this diagnosis. Writer encouraged her to speak with her doctor on tomorrow concerning this matter. She was compliant with her medication, pleasant and cooperative. Safety maintained with 15 min checks.

## 2019-08-25 DIAGNOSIS — F200 Paranoid schizophrenia: Secondary | ICD-10-CM

## 2019-08-25 MED ORDER — OLANZAPINE 10 MG PO TBDP
10.0000 mg | ORAL_TABLET | Freq: Every day | ORAL | Status: DC
  Administered 2019-08-25 – 2019-08-27 (×3): 10 mg via ORAL
  Filled 2019-08-25 (×6): qty 1

## 2019-08-25 MED ORDER — FOLIC ACID 1 MG PO TABS
1.0000 mg | ORAL_TABLET | Freq: Every day | ORAL | Status: DC
  Administered 2019-08-25 – 2019-08-28 (×4): 1 mg via ORAL
  Filled 2019-08-25 (×6): qty 1

## 2019-08-25 MED ORDER — THIAMINE HCL 100 MG PO TABS
100.0000 mg | ORAL_TABLET | Freq: Every day | ORAL | Status: DC
  Administered 2019-08-25 – 2019-08-28 (×4): 100 mg via ORAL
  Filled 2019-08-25 (×6): qty 1

## 2019-08-25 MED ORDER — TAB-A-VITE/IRON PO TABS
1.0000 | ORAL_TABLET | Freq: Every day | ORAL | Status: DC
  Administered 2019-08-25 – 2019-08-28 (×4): 1 via ORAL
  Filled 2019-08-25 (×7): qty 1

## 2019-08-25 NOTE — [facility] Counselor (Signed)
CSW spoke with this patients guardian, Zenovia Jarred [phone]) and addressed questions and concerns she had regarding this patients care.    Ruthann Cancer MSW, [employer] Clincal Social Worker  [facility]

## 2019-08-25 NOTE — Tx Team (Signed)
Interdisciplinary Treatment and Diagnostic Plan Update  08/25/2019 Time of Session: 1:40pm  Jessica Foster MRN: [id number]  Principal Diagnosis: <principal problem not specified>  Secondary Diagnoses: Active Problems:   Schizophrenia ([place])   Current Medications:  Current Facility-Administered Medications  Medication Dose Route Frequency Provider Last Rate Last Admin  . acetaminophen (TYLENOL) tablet 650 mg  650 mg Oral Q6H PRN Lindon Romp A, NP      . alum & mag hydroxide-simeth (MAALOX/MYLANTA) 200-200-20 MG/5ML suspension 30 mL  30 mL Oral Q4H PRN Lindon Romp A, NP      . folic acid (FOLVITE) tablet 1 mg  1 mg Oral Daily Sharma Covert, MD      . hydrochlorothiazide (MICROZIDE) capsule 12.5 mg  12.5 mg Oral Daily Sharma Covert, MD   12.5 mg at 08/25/19 0762  . hydrOXYzine (ATARAX/VISTARIL) tablet 25 mg  25 mg Oral TID PRN Lindon Romp A, NP      . ziprasidone (GEODON) injection 10 mg  10 mg Intramuscular Q12H PRN Rozetta Nunnery, NP       And  . LORazepam (ATIVAN) tablet 1 mg  1 mg Oral PRN Lindon Romp A, NP      . magnesium hydroxide (MILK OF MAGNESIA) suspension 30 mL  30 mL Oral Daily PRN Lindon Romp A, NP      . multivitamins with iron tablet 1 tablet  1 tablet Oral Daily Sharma Covert, MD      . OLANZapine zydis (ZYPREXA) disintegrating tablet 10 mg  10 mg Oral QHS Sharma Covert, MD      . potassium chloride SA (KLOR-CON) CR tablet 20 mEq  20 mEq Oral Daily Sharma Covert, MD   20 mEq at 08/25/19 0832  . thiamine tablet 100 mg  100 mg Oral Daily Sharma Covert, MD      . traZODone (DESYREL) tablet 50 mg  50 mg Oral QHS PRN Rozetta Nunnery, NP       PTA Medications: Medications Prior to Admission  Medication Sig Dispense Refill Last Dose  . ferrous sulfate 325 (65 FE) MG tablet Take 1 tablet (325 mg total) by mouth daily. (Patient not taking: Reported on 08/23/2019) 30 tablet 0     Patient Stressors: Financial difficulties Loss of  housing  Patient Strengths: Average or above average intelligence Communication skills Physical Health  Treatment Modalities: Medication Management, Group therapy, Case management,  1 to 1 session with clinician, Psychoeducation, Recreational therapy.   Physician Treatment Plan for Primary Diagnosis: <principal problem not specified> Long Term Goal(s): Improvement in symptoms so as ready for discharge Improvement in symptoms so as ready for discharge   Short Term Goals: Ability to identify changes in lifestyle to reduce recurrence of condition will improve Ability to verbalize feelings will improve Ability to demonstrate self-control will improve Ability to identify and develop effective coping behaviors will improve Ability to maintain clinical measurements within normal limits will improve Compliance with prescribed medications will improve Ability to identify changes in lifestyle to reduce recurrence of condition will improve Ability to verbalize feelings will improve Ability to demonstrate self-control will improve Ability to identify and develop effective coping behaviors will improve Ability to maintain clinical measurements within normal limits will improve Compliance with prescribed medications will improve  Medication Management: Evaluate patient's response, side effects, and tolerance of medication regimen.  Therapeutic Interventions: 1 to 1 sessions, Unit Group sessions and Medication administration.  Evaluation of Outcomes: Not Met  Physician Treatment Plan  for Secondary Diagnosis: Active Problems:   Schizophrenia ([place])  Long Term Goal(s): Improvement in symptoms so as ready for discharge Improvement in symptoms so as ready for discharge   Short Term Goals: Ability to identify changes in lifestyle to reduce recurrence of condition will improve Ability to verbalize feelings will improve Ability to demonstrate self-control will improve Ability to identify and  develop effective coping behaviors will improve Ability to maintain clinical measurements within normal limits will improve Compliance with prescribed medications will improve Ability to identify changes in lifestyle to reduce recurrence of condition will improve Ability to verbalize feelings will improve Ability to demonstrate self-control will improve Ability to identify and develop effective coping behaviors will improve Ability to maintain clinical measurements within normal limits will improve Compliance with prescribed medications will improve     Medication Management: Evaluate patient's response, side effects, and tolerance of medication regimen.  Therapeutic Interventions: 1 to 1 sessions, Unit Group sessions and Medication administration.  Evaluation of Outcomes: Not Met   RN Treatment Plan for Primary Diagnosis: <principal problem not specified> Long Term Goal(s): Knowledge of disease and therapeutic regimen to maintain health will improve  Short Term Goals: Ability to remain free from injury will improve, Ability to verbalize frustration and anger appropriately will improve, Ability to identify and develop effective coping behaviors will improve and Compliance with prescribed medications will improve  Medication Management: RN will administer medications as ordered by provider, will assess and evaluate patient's response and provide education to patient for prescribed medication. RN will report any adverse and/or side effects to prescribing provider.  Therapeutic Interventions: 1 on 1 counseling sessions, Psychoeducation, Medication administration, Evaluate responses to treatment, Monitor vital signs and CBGs as ordered, Perform/monitor CIWA, COWS, AIMS and Fall Risk screenings as ordered, Perform wound care treatments as ordered.  Evaluation of Outcomes: Not Met   LCSW Treatment Plan for Primary Diagnosis: <principal problem not specified> Long Term Goal(s): Safe transition  to appropriate next level of care at discharge, Engage patient in therapeutic group addressing interpersonal concerns.  Short Term Goals: Engage patient in aftercare planning with referrals and resources, Increase social support, Identify triggers associated with mental health/substance abuse issues and Increase skills for wellness and recovery  Therapeutic Interventions: Assess for all discharge needs, 1 to 1 time with Social worker, Explore available resources and support systems, Assess for adequacy in community support network, Educate family and significant other(s) on suicide prevention, Complete Psychosocial Assessment, Interpersonal group therapy.  Evaluation of Outcomes: Not Met   Progress in Treatment: Attending groups: Yes. Participating in groups: Yes. Taking medication as prescribed: Yes. Toleration medication: Yes. Family/Significant other contact made: No, will contact:  if consent is given. Patient understands diagnosis: Yes. Discussing patient identified problems/goals with staff: Yes. Medical problems stabilized or resolved: Yes. Denies suicidal/homicidal ideation: Yes. Issues/concerns per patient self-inventory: No.   New problem(s) identified: No, Describe:  none.  New Short Term/Long Term Goal(s): medication stabilization, elimination of SI thoughts, development of comprehensive mental wellness plan.   Patient Goals:  "To leave"  Discharge Plan or Barriers: Patient recently admitted. CSW will continue to follow and assess for appropriate referrals and possible discharge planning.   Reason for Continuation of Hospitalization: Delusions  Mania Medication stabilization  Estimated Length of Stay: 3-5 days  Attendees: Patient: Jessica Foster 08/25/2019   Physician: Myles Lipps, MD 08/25/2019   Nursing:  08/25/2019   RN Care Manager: 08/25/2019   Social Worker: Darletta Moll, Latanya Presser 08/25/2019   Recreational Therapist:  08/25/2019  Other:  08/25/2019   Other:   08/25/2019   Other: 08/25/2019      Scribe for Treatment Team: Vassie Moselle, LCSW 08/25/2019 4:21 PM

## 2019-08-25 NOTE — Progress Notes (Signed)
Recreation Therapy Notes  INPATIENT RECREATION THERAPY ASSESSMENT  Patient Details Name: Jessica Foster MRN: [id number] DOB: 19-Dec-1985 Today's Date: 08/25/2019       Information Obtained From: Patient  Able to Participate in Assessment/Interview: Yes  Patient Presentation: Alert  Reason for Admission (Per Patient): Other (Comments) (Pt stated a relative stated she was patients gaurdian and had her committed here.)  Patient Stressors:  (None identified)  Coping Skills:   Music, Exercise, Talk, Prayer, Avoidance, Dance  Leisure Interests (2+):  Music - Listen, Social - Family, Individual - Other (Comment) (Watch movies)  Frequency of Recreation/Participation: Other (Comment) (Daily)  Awareness of Community Resources:  Yes  Community Resources:  [place], Public affairs consultant, [employer]  Current Use: Yes  If no, Barriers?:    Expressed Interest in [employer] Information: No  [employer] of Residence:  Engineer, technical sales  Patient Main Form of Transportation: Therapist, music  Patient Strengths:  Being straight forward; Being detailed; Telling the truth; Don't try to hide things/Open to discussions  Patient Identified Areas of Improvement:  None  Patient Goal for Hospitalization:  "Follow what is being told to me and show no issues with compling"  Current SI (including self-harm):  No  Current HI:  No  Current AVH: No  Staff Intervention Plan: Group Attendance, Collaborate with Interdisciplinary Treatment Team  Consent to Intern Participation: N/A    Caroll Rancher, LRT/CTRS  Caroll Rancher A 08/25/2019, 12:09 PM

## 2019-08-25 NOTE — Progress Notes (Signed)
[facility]  08/25/2019 2:06 PM Jessica BergeronDevin S Foster  MRN:  [id number] Subjective: Patient is a 34 year old female who [facility] department on 08/22/2019 under involuntary commitment.  She has a reported history of schizophrenia, noncompliant with medications, and was significantly paranoid.  Objective: Patient is seen and examined.  Patient is a 34 year old female with the above-stated past psychiatric history who is seen in follow-up.  Her main objective today is to make sure that the schizophrenia diagnosis which is in the chart is taken out.  She stated she is not schizophrenic.  She states she is following all the rules, and expects to be discharged soon.  He continues to state that woman who is reportedly her guardian is "not my guardian".  She stated she needs to go to a court and make sure that the court understands that this woman is not her guardian.  She stated she is a family member, but she never sees her.  She was prescribed Zyprexa 5 mg p.o. nightly.  She reportedly did not sleep well at the beginning of her shift.  She was focused on her schizophrenia diagnosis.  Her vital signs are stable, she is afebrile.  Initially her blood pressure was elevated this morning at 159/107, but repeat blood pressure was 138/78.  She slept 6 hours last night.  She denied any auditory or visual hallucinations.  She denied any suicidal or homicidal ideation.  She still is guarded and paranoid.  Review of her laboratories showed essentially normal lipid panel her iron studies showed a ferritin of 15 which is low normal, a folic acid of 5.1 which is low, and TIBC of 396.  Her iron level was low normal at 35.  Her TSH was 3.279.  EKG showed a sinus tachycardia with a QTC of 439.  Principal Problem: <principal problem not specified> Diagnosis: Active Problems:   Schizophrenia ([facility])  Total Time spent with patient: 20 minutes  Past Psychiatric History: See admission  H&P  Past Medical History:  Past Medical History:  Diagnosis Date  . Obesity    History reviewed. No pertinent surgical history. Family History: History reviewed. No pertinent family history. Family Psychiatric  History: See admission H&P Social History:  Social History   Substance and Sexual Activity  Alcohol Use No     Social History   Substance and Sexual Activity  Drug Use No    Social History   Socioeconomic History  . Marital status: Single    Spouse name: Not on file  . Number of children: Not on file  . Years of education: Not on file  . Highest education level: Not on file  Occupational History  . Not on file  Tobacco Use  . Smoking status: Current Every Day Smoker    Packs/day: 0.50    Years: 3.00    Pack years: 1.50    Types: Cigarettes  . Smokeless tobacco: Never Used  Substance and Sexual Activity  . Alcohol use: No  . Drug use: No  . Sexual activity: Yes    Birth control/protection: Condom  Other Topics Concern  . Not on file  Social History Narrative  . Not on file   Social Determinants of Health   Financial Resource Strain:   . Difficulty of Paying Living Expenses:   Food Insecurity:   . Worried About Programme researcher, broadcasting/film/videounning Out of Food in the Last Year:   . Baristaan Out of Food in the Last Year:   Cablevision Systemsransportation  Needs:   . Lack of Transportation (Medical):   Marland Kitchen Lack of Transportation (Non-Medical):   Physical Activity:   . Days of Exercise per Week:   . Minutes of Exercise per Session:   Stress:   . Feeling of Stress :   Social Connections:   . Frequency of Communication with Friends and Family:   . Frequency of Social Gatherings with Friends and Family:   . Attends Religious Services:   . Active Member of Clubs or Organizations:   . Attends Banker Meetings:   Marland Kitchen Marital Status:    Additional Social History:                         Sleep: Fair  Appetite:  Fair  Current Medications: Current Facility-Administered  Medications  Medication Dose Route Frequency Provider Last Rate Last Admin  . acetaminophen (TYLENOL) tablet 650 mg  650 mg Oral Q6H PRN [name] A, NP      . alum & mag hydroxide-simeth (MAALOX/MYLANTA) 200-200-20 MG/5ML suspension 30 mL  30 mL Oral Q4H PRN [name] A, NP      . hydrochlorothiazide (MICROZIDE) capsule 12.5 mg  12.5 mg Oral Daily Antonieta Pert, MD   12.5 mg at 08/25/19 0037  . hydrOXYzine (ATARAX/VISTARIL) tablet 25 mg  25 mg Oral TID PRN [name] A, NP      . ziprasidone (GEODON) injection 10 mg  10 mg Intramuscular Q12H PRN Jackelyn Poling, NP       And  . LORazepam (ATIVAN) tablet 1 mg  1 mg Oral PRN [name] A, NP      . magnesium hydroxide (MILK OF MAGNESIA) suspension 30 mL  30 mL Oral Daily PRN [name] A, NP      . OLANZapine zydis (ZYPREXA) disintegrating tablet 5 mg  5 mg Oral QHS [name] A, NP   5 mg at 08/24/19 2059  . potassium chloride SA (KLOR-CON) CR tablet 20 mEq  20 mEq Oral Daily Antonieta Pert, MD   20 mEq at 08/25/19 0832  . traZODone (DESYREL) tablet 50 mg  50 mg Oral QHS PRN Jackelyn Poling, NP        Lab Results:  Results for orders placed or performed during the hospital encounter of 08/23/19 (from the past 48 hour(s))  Hemoglobin A1c     Status: None   Collection Time: 08/24/19  7:13 AM  Result Value Ref Range   Hgb A1c MFr Bld 5.6 4.8 - 5.6 %    Comment: (NOTE) Pre diabetes:          5.7%-6.4%  Diabetes:              >6.4%  Glycemic control for   <7.0% adults with diabetes    Mean Plasma Glucose 114.02 mg/dL    Comment: Performed at [facility], [address], [place], [place] [address]  Lipid panel     Status: None   Collection Time: 08/24/19  7:13 AM  Result Value Ref Range   Cholesterol 145 0 - 200 mg/dL   Triglycerides 49 <916 mg/dL   HDL 46 >94 mg/dL   Total CHOL/HDL Ratio 3.2 RATIO   VLDL 10 0 - 40 mg/dL   LDL Cholesterol 89 0 - 99 mg/dL    Comment:        Total Cholesterol/HDL:CHD  Risk Coronary Heart Disease Risk Table  Men   Women  1/2 Average Risk   3.4   3.3  Average Risk       5.0   4.4  2 X Average Risk   9.6   7.1  3 X Average Risk  23.4   11.0        Use the calculated Patient Ratio above and the CHD Risk Table to determine the patient's CHD Risk.        ATP III CLASSIFICATION (LDL):  <100     mg/dL   Optimal  283-151  mg/dL   Near or Above                    Optimal  130-159  mg/dL   Borderline  761-607  mg/dL   High  >371     mg/dL   Very High Performed at [facility], [address] [place], [place] [address]   TSH     Status: None   Collection Time: 08/24/19  7:13 AM  Result Value Ref Range   TSH 3.279 0.350 - 4.500 uIU/mL    Comment: Performed by a 3rd Generation assay with a functional sensitivity of <=0.01 uIU/mL. Performed at [facility], [address], [place], [place] [address]   Vitamin B12     Status: None   Collection Time: 08/24/19  5:39 PM  Result Value Ref Range   Vitamin B-12 594 180 - 914 pg/mL    Comment: (NOTE) This assay is not validated for testing neonatal or myeloproliferative syndrome specimens for Vitamin B12 levels. Performed at [facility], [address], [place], [place] [address]   Folate     Status: Abnormal   Collection Time: 08/24/19  5:39 PM  Result Value Ref Range   Folate 5.1 (L) >5.9 ng/mL    Comment: Performed at [facility], [address], [place], [place] [address]  Iron and TIBC     Status: Abnormal   Collection Time: 08/24/19  5:39 PM  Result Value Ref Range   Iron 35 28 - 170 ug/dL   TIBC 182 993 - 716 ug/dL   Saturation Ratios 9 (L) 10.4 - 31.8 %   UIBC 361 ug/dL    Comment: Performed at [facility], [address] [place], [place] [address]  Ferritin     Status: None   Collection Time: 08/24/19  5:39 PM  Result Value Ref Range   Ferritin 15 11 - 307 ng/mL     Comment: Performed at [facility], [address] [place], [place] [address]  Reticulocytes     Status: None   Collection Time: 08/24/19  5:39 PM  Result Value Ref Range   Retic Ct Pct 1.1 0.4 - 3.1 %   RBC. 4.41 3.87 - 5.11 MIL/uL   Retic Count, Absolute 48.5 19.0 - 186.0 K/uL   Immature Retic Fract 9.8 2.3 - 15.9 %    Comment: Performed at [facility], [address], [place], [place] [address]    Blood Alcohol level:  Lab Results  Component Value Date   ETH <10 08/22/2019    Metabolic Disorder Labs: Lab Results  Component Value Date   HGBA1C 5.6 08/24/2019   MPG 114.02 08/24/2019   No results found for: PROLACTIN Lab Results  Component Value Date   CHOL 145 08/24/2019   TRIG 49 08/24/2019   HDL 46 08/24/2019   CHOLHDL 3.2 08/24/2019  VLDL 10 08/24/2019   LDLCALC 89 08/24/2019    Physical Findings: AIMS:  , ,  ,  ,    CIWA:    COWS:     Musculoskeletal: Strength & Muscle Tone: within normal limits Gait & Station: normal Patient leans: N/A  Psychiatric Specialty Exam: Physical Exam Vitals and nursing note reviewed.  Constitutional:      Appearance: She is obese.  HENT:     Head: Normocephalic and atraumatic.  Pulmonary:     Effort: Pulmonary effort is normal.  Neurological:     General: No focal deficit present.     Mental Status: She is alert and oriented to person, place, and time.     Review of Systems  Blood pressure 138/78, pulse 88, temperature 98.3 F (36.8 C), resp. rate 20, height 5\' 2"  (1.575 m), weight (!) 140.6 kg, last menstrual period 08/16/2019, SpO2 98 %.Body mass index is 56.69 kg/m.  General Appearance: Casual  Eye Contact:  Fair  Speech:  Normal Rate  Volume:  Increased  Mood:  Dysphoric and Irritable  Affect:  Congruent  Thought Process:  Goal Directed and Descriptions of Associations: Loose  Orientation:  Full (Time, Place, and Person)  Thought Content:  Delusions and Paranoid  Ideation  Suicidal Thoughts:  No  Homicidal Thoughts:  No  Memory:  Immediate;   Fair Recent;   Fair Remote;   Fair  Judgement:  Impaired  Insight:  Lacking  Psychomotor Activity:  Increased  Concentration:  Concentration: Fair and Attention Span: Fair  Recall:  10/17/2019 of Knowledge:  Good  Language:  Good  Akathisia:  Negative  Handed:  Right  AIMS (if indicated):     Assets:  Desire for Improvement Resilience  ADL's:  Intact  Cognition:  WNL  Sleep:  Number of Hours: 6     Treatment Plan Summary: Daily contact with patient to assess and evaluate symptoms and progress in treatment, Medication management and Plan : Patient is seen and examined.  Patient is a 34 year old female with the above-stated past psychiatric history who is seen in follow-up.   Diagnosis: 1.  Schizophrenia 2.  Iron deficiency anemia 3.  Folic acid deficiency 4.  Hypertension  Pertinent findings on examination today: 1.  Continued paranoia 2.  Continue to have some mild sleep disturbance  Plan: 1.  Add a multivitamin with iron for iron deficiency anemia 2.  Add folic acid 1 mg p.o. daily for nutritional supplementation. 3.  Increase olanzapine to 10 mg p.o. nightly for paranoia. 4.  Continue potassium chloride 20 mEq p.o. daily to replete potassium. 5.  Continue trazodone 50 mg p.o. nightly as needed insomnia. 6.  Disposition planning-in progress.  20, MD 08/25/2019, 2:06 PM

## 2019-08-25 NOTE — Progress Notes (Signed)
Recreation Therapy Notes  Date: 7.19.21 Time: 1010 Location: [address] Dayroom  Group Topic: Coping Skills  Goal Area(s) Addresses:  Patient will identify healthy and unhealthy coping strategies. Patient will identify consequences of using unhealthy coping strategies. Patient will identify benefit of using healthy coping strategies.  Behavioral Response: Engaged  Intervention: Worksheet, pencils  Activity: Unhealthy vs. Healthy Coping Strategies.  Patients were to identify a problem they are currently dealing with.  Patients then identified unhealthy coping strategies they have used and the consequences of it.  Patients also identified healthy coping strategies and the expected outcomes of using healthy coping strategies.  Education: Pharmacologist, Building control surveyor.   Education Outcome: Acknowledges understanding/In group clarification offered/Needs additional education.   Clinical Observations/Feedback: Pt identified biggest problem as finding another job.  Pt stated the only thing to do was to keep looking and not give up until a job has been found.    Caroll Rancher, LRT/CTRS    Caroll Rancher A 08/25/2019 11:48 AM

## 2019-08-25 NOTE — Progress Notes (Signed)
°   08/25/19 2100  Psych Admission Type (Psych Patients Only)  Admission Status Involuntary  Psychosocial Assessment  Patient Complaints Anxiety;Suspiciousness  Eye Contact Avertive;Brief  Facial Expression Anxious;Pensive;Worried  Affect Anxious;Appropriate to circumstance  Speech Logical/coherent  Interaction Assertive  Motor Activity Other (Comment) (WNL)  Appearance/Hygiene Disheveled;Poor hygiene;In scrubs  Behavior Characteristics Appropriate to situation  Mood Anxious;Pleasant  Aggressive Behavior  Effect No apparent injury  Thought Process  Coherency Circumstantial  Content Blaming others;Preoccupation  Delusions None reported or observed  Perception Hallucinations  Hallucination Auditory  Judgment WDL  Confusion None  Danger to Self  Current suicidal ideation? Denies  Danger to Others  Danger to Others None reported or observed

## 2019-08-26 NOTE — Progress Notes (Signed)
[facility] MD Progress Note  08/26/2019 10:38 AM Jessica Foster  MRN:  [id number] Subjective:  "I'm fine."  Jessica Foster found lying in bed. She is calm and cooperative on assessment and appears euthymic. She reports good mood. She continues to dispute the schizophrenia diagnosis which is reported in her chart. She continues to state that the guardian is "not her guardian" but states she is a family member she rarely sees. She denies AVH and shows no signs of responding to internal stimuli. She is requesting to discharge related to not feeling safe around some of the other patients on the unit. She does continue to display some paranoia related to her admission, stating the process of bringing her to the hospital was "shady," but appears less fixated on paranoia at this time. She reports good mood. No agitated or disruptive behaviors on the unit.  From admission H&P: Patient is a 34 year old female who originally presented to the [facility] emergency department on 08/22/2019 under involuntary commitment. The patient stated that someone is pretending to be her guardian, and it be involuntarily committed her. She stated she is unsure why, but believes that "the devil is involved with this". She stated that the person who claims to be her guardian is a family member, but "she is not my guardian".   Principal Problem: <principal problem not specified> Diagnosis: Active Problems:   Schizophrenia (HCC)  Total Time spent with patient: 15 minutes  Past Psychiatric History: See admission H&P  Past Medical History:  Past Medical History:  Diagnosis Date  . Obesity    History reviewed. No pertinent surgical history. Family History: History reviewed. No pertinent family history. Family Psychiatric  History: See admission H&P Social History:  Social History   Substance and Sexual Activity  Alcohol Use No     Social History   Substance and Sexual Activity  Drug Use No    Social  History   Socioeconomic History  . Marital status: Single    Spouse name: Not on file  . Number of children: Not on file  . Years of education: Not on file  . Highest education level: Not on file  Occupational History  . Not on file  Tobacco Use  . Smoking status: Current Every Day Smoker    Packs/day: 0.50    Years: 3.00    Pack years: 1.50    Types: Cigarettes  . Smokeless tobacco: Never Used  Substance and Sexual Activity  . Alcohol use: No  . Drug use: No  . Sexual activity: Yes    Birth control/protection: Condom  Other Topics Concern  . Not on file  Social History Narrative  . Not on file   Social Determinants of Health   Financial Resource Strain:   . Difficulty of Paying Living Expenses:   Food Insecurity:   . Worried About Programme researcher, broadcasting/film/video in the Last Year:   . Barista in the Last Year:   Transportation Needs:   . Freight forwarder (Medical):   Marland Kitchen Lack of Transportation (Non-Medical):   Physical Activity:   . Days of Exercise per Week:   . Minutes of Exercise per Session:   Stress:   . Feeling of Stress :   Social Connections:   . Frequency of Communication with Friends and Family:   . Frequency of Social Gatherings with Friends and Family:   . Attends Religious Services:   . Active Member of Clubs or Organizations:   .  Attends BankerClub or Organization Meetings:   Marland Kitchen. Marital Status:    Additional Social History:                         Sleep: Good  Appetite:  Good  Current Medications: Current Facility-Administered Medications  Medication Dose Route Frequency Provider Last Rate Last Admin  . acetaminophen (TYLENOL) tablet 650 mg  650 mg Oral Q6H PRN Nira ConnBerry, Jason A, NP      . alum & mag hydroxide-simeth (MAALOX/MYLANTA) 200-200-20 MG/5ML suspension 30 mL  30 mL Oral Q4H PRN Nira ConnBerry, Jason A, NP      . folic acid (FOLVITE) tablet 1 mg  1 mg Oral Daily Antonieta Pertlary, Greg Lawson, MD   1 mg at 08/26/19 0755  . hydrochlorothiazide  (MICROZIDE) capsule 12.5 mg  12.5 mg Oral Daily Antonieta Pertlary, Greg Lawson, MD   12.5 mg at 08/26/19 0755  . hydrOXYzine (ATARAX/VISTARIL) tablet 25 mg  25 mg Oral TID PRN Nira ConnBerry, Jason A, NP      . ziprasidone (GEODON) injection 10 mg  10 mg Intramuscular Q12H PRN Jackelyn PolingBerry, Jason A, NP       And  . LORazepam (ATIVAN) tablet 1 mg  1 mg Oral PRN Nira ConnBerry, Jason A, NP      . magnesium hydroxide (MILK OF MAGNESIA) suspension 30 mL  30 mL Oral Daily PRN Nira ConnBerry, Jason A, NP      . multivitamins with iron tablet 1 tablet  1 tablet Oral Daily Antonieta Pertlary, Greg Lawson, MD   1 tablet at 08/26/19 0755  . OLANZapine zydis (ZYPREXA) disintegrating tablet 10 mg  10 mg Oral QHS Antonieta Pertlary, Greg Lawson, MD   10 mg at 08/25/19 2038  . potassium chloride SA (KLOR-CON) CR tablet 20 mEq  20 mEq Oral Daily Antonieta Pertlary, Greg Lawson, MD   20 mEq at 08/26/19 0756  . thiamine tablet 100 mg  100 mg Oral Daily Antonieta Pertlary, Greg Lawson, MD   100 mg at 08/26/19 0755  . traZODone (DESYREL) tablet 50 mg  50 mg Oral QHS PRN Jackelyn PolingBerry, Jason A, NP        Lab Results:  Results for orders placed or performed during the hospital encounter of 08/23/19 (from the past 48 hour(s))  Vitamin B12     Status: None   Collection Time: 08/24/19  5:39 PM  Result Value Ref Range   Vitamin B-12 594 180 - 914 pg/mL    Comment: (NOTE) This assay is not validated for testing neonatal or myeloproliferative syndrome specimens for Vitamin B12 levels. Performed at [facility], [address] [place], [place] [address]   Folate     Status: Abnormal   Collection Time: 08/24/19  5:39 PM  Result Value Ref Range   Folate 5.1 (L) >5.9 ng/mL    Comment: Performed at [facility], [address] [place], [place] [address]  Iron and TIBC     Status: Abnormal   Collection Time: 08/24/19  5:39 PM  Result Value Ref Range   Iron 35 28 - 170 ug/dL   TIBC 528396 413250 - 244450 ug/dL   Saturation Ratios 9 (L) 10.4 - 31.8 %   UIBC 361 ug/dL    Comment:  Performed at [facility], [address] [place], [place] [address]  Ferritin     Status: None   Collection Time: 08/24/19  5:39 PM  Result Value Ref Range   Ferritin 15 11 - 307 ng/mL  Comment: Performed at [facility], [address], [place], [place] [address]  Reticulocytes     Status: None   Collection Time: 08/24/19  5:39 PM  Result Value Ref Range   Retic Ct Pct 1.1 0.4 - 3.1 %   RBC. 4.41 3.87 - 5.11 MIL/uL   Retic Count, Absolute 48.5 19.0 - 186.0 K/uL   Immature Retic Fract 9.8 2.3 - 15.9 %    Comment: Performed at [facility], [address], [place], [place] [address]    Blood Alcohol level:  Lab Results  Component Value Date   ETH <10 08/22/2019    Metabolic Disorder Labs: Lab Results  Component Value Date   HGBA1C 5.6 08/24/2019   MPG 114.02 08/24/2019   No results found for: PROLACTIN Lab Results  Component Value Date   CHOL 145 08/24/2019   TRIG 49 08/24/2019   HDL 46 08/24/2019   CHOLHDL 3.2 08/24/2019   VLDL 10 08/24/2019   LDLCALC 89 08/24/2019    Physical Findings: AIMS:  , ,  ,  ,    CIWA:    COWS:     Musculoskeletal: Strength & Muscle Tone: within normal limits Gait & Station: normal Patient leans: N/A  Psychiatric Specialty Exam: Physical Exam Vitals and nursing note reviewed.  Constitutional:      Appearance: She is well-developed.  Cardiovascular:     Rate and Rhythm: Normal rate.  Pulmonary:     Effort: Pulmonary effort is normal.  Neurological:     Mental Status: She is alert and oriented to person, place, and time.     Review of Systems  Constitutional: Negative.   Respiratory: Negative for cough and shortness of breath.   Psychiatric/Behavioral: Negative for agitation, behavioral problems, confusion, dysphoric mood, hallucinations, self-injury, sleep disturbance and suicidal ideas. The patient is not nervous/anxious and is not hyperactive.     Blood pressure  (!) 132/99, pulse (!) 107, temperature 97.8 F (36.6 C), temperature source Oral, resp. rate 20, height 5\' 2"  (1.575 m), weight (!) 140.6 kg, last menstrual period 08/16/2019, SpO2 98 %.Body mass index is 56.69 kg/m.  General Appearance: Casual  Eye Contact:  Good  Speech:  Normal Rate  Volume:  Normal  Mood:  Euthymic  Affect:  Appropriate and Congruent  Thought Process:  Coherent  Orientation:  Full (Time, Place, and Person)  Thought Content:  Paranoid Ideation  Suicidal Thoughts:  No  Homicidal Thoughts:  No  Memory:  Immediate;   Fair Recent;   Fair  Judgement:  Intact  Insight:  Lacking  Psychomotor Activity:  Normal  Concentration:  Concentration: Good and Attention Span: Good  Recall:  10/17/2019 of Knowledge:  Fair  Language:  Fair  Akathisia:  No  Handed:  Right  AIMS (if indicated):     Assets:  Communication Skills Desire for Improvement Leisure Time  ADL's:  Intact  Cognition:  WNL  Sleep:  Number of Hours: 7.5     Treatment Plan Summary: Daily contact with patient to assess and evaluate symptoms and progress in treatment and Medication management   Continue inpatient hospitalization.  Continue Zyprexa 10 mg PO QHS for psychosis Continue HCTZ 12.5 mg PO daily for HTN Continue folic acid 1 mg PO daily for supplementation Continue thiamine 100 mg PO daily for supplementation Continue Vistaril 25 mg PO TID PRN anxiety Continue trazodone 50 mg PO QHS PRN insomnia Continue agitation protocol PRN agitation  Patient will participate in the therapeutic  group milieu.  Discharge disposition in progress.   Aldean Baker, NP 08/26/2019, 10:38 AM

## 2019-08-26 NOTE — Clinical Social Work Note (Signed)
Received a call from Ms Maryan Char who wanted to get an update about patient.  She confirmed that she has paperwork re: court guardianship which she will e-mail to me, thus allowing me to respond to her as patient's guardian. In the meantime, Ms Ventress has declined permission to allow me to speak with brother or Ms Maryan Char, and states she has no intention of taking medications upon d/c, and declines a referral to a mental health clinic.

## 2019-08-26 NOTE — Progress Notes (Addendum)
   08/26/19 1120  Psych Admission Type (Psych Patients Only)  Admission Status Involuntary  Psychosocial Assessment  Patient Complaints None  Eye Contact Avertive;Brief  Facial Expression Anxious;Pensive;Worried  Affect Anxious;Appropriate to circumstance  Speech Logical/coherent  Interaction Assertive  Motor Activity Other (Comment) (WNL)  Appearance/Hygiene Disheveled;Poor hygiene;In scrubs  Behavior Characteristics Cooperative  Mood Anxious;Suspicious;Labile  Aggressive Behavior  Effect No apparent injury  Thought Process  Coherency Circumstantial  Content Blaming others;Preoccupation  Delusions None reported or observed  Perception Hallucinations  Hallucination Auditory  Judgment WDL  Confusion None  Danger to Self  Current suicidal ideation? Denies  Danger to Others  Danger to Others None reported or observed

## 2019-08-26 NOTE — B[facility] Counselor (Signed)
Adult Comprehensive Assessment  Patient ID: Jessica Foster, female   DOB: 1985/07/26, 34 y.o.   MRN: [id number]  Information Source: Information source: Patient  Current Stressors:  Patient states their primary concerns and needs for treatment are:: None Patient states their goals for this hospitilization and ongoing recovery are:: None Educational / Learning stressors: None Employment / Job issues: Unemployed.  "I need to get a job, but it has to be the right fit." Has not worked for 2 years Family Relationships: Brother Surveyor, quantity / Lack of resources (include bankruptcy): No income Housing / Lack of housing: Homeless Physical health (include injuries & life threatening diseases): N/A Social relationships: None Substance abuse: N/A Bereavement / Loss: Mother died in 2007-06-12  Living/Environment/Situation:  Living Arrangements: Other relatives (brother) Living conditions (as described by patient or guardian): Had been staying with brother, but last couple months have been homeless-no tent-been on the streets-brother just had a baby and that was why she left How long has patient lived in current situation?: couple months What is atmosphere in current home: Temporary  Family History:  Has your sexual activity been affected by drugs, alcohol, medication, or emotional stress?: Heterosexual Does patient have children?: No  Childhood History:  By whom was/is the patient raised?: Mother Additional childhood history information: Mother died in 06/12/07 Description of patient's relationship with caregiver when they were a child: great Does patient have siblings?: Yes Number of Siblings: 1 Description of patient's current relationship with siblings: good Did patient suffer any verbal/emotional/physical/sexual abuse as a child?: No Did patient suffer from severe childhood neglect?: No Has patient ever been sexually abused/assaulted/raped as an adolescent or adult?: No Was the patient ever a  victim of a crime or a disaster?: No Witnessed domestic violence?: No Has patient been affected by domestic violence as an adult?: No  Education:  Highest grade of school patient has completed: GED, [employer] in Health and safety inspector Currently a student?: No Learning disability?: No  Employment/Work Situation:   Employment situation: Unemployed Patient's job has been impacted by current illness: No What is the longest time patient has a held a job?: 7 years Where was the patient employed at that time?: [employer]-that was 2 years agoi Has patient ever been in the [employer]?: No  Financial Resources:   Does patient have a Lawyer or guardian?: Yes Name of representative payee or guardian: Patient says no, chart says yes; CSW will contact "guardian" to ask for paperwork  Alcohol/Substance Abuse:   What has been your use of drugs/alcohol within the last 12 months?: cigarettes Alcohol/Substance Abuse Treatment Hx: Denies past history Has alcohol/substance abuse ever caused legal problems?: No  Social Support System:   Patient's Community Support System: Good Describe Community Support System: brother Type of faith/religion: [employer]ri[employer] Kn[employer] How does patient's faith help to cope with current illness?: Praying and having a rleationship with the Lord helps  Leisure/Recreation:   Do You Have Hobbies?: Yes Leisure and Hobbies: listening to music, talking, learning, taking walks  Strengths/Needs:   What is the patient's perception of their strengths?: problem solving, listening, patience, thinking outside of the box, resourceful Patient states they can use these personal strengths during their treatment to contribute to their recovery: patience Patient states these barriers may affect/interfere with their treatment: none Patient states these barriers may affect their return to the community: none  Discharge Plan:   Currently receiving community mental health services: No Patient  states concerns and preferences for aftercare planning are: "I am not interested in  seeing anyone." Patient states they will know when they are safe and ready for discharge when: Ready now Does patient have access to transportation?: Yes Does patient have financial barriers related to discharge medications?: Yes Patient description of barriers related to discharge medications: No insurance Will patient be returning to same living situation after discharge?: Yes  Summary/Recommendations:   Summary and Recommendations (to be completed by the evaluator): Jessica Foster is a 34 YO AA female who was IVCed by her "guardian" Blair Dolphin, who writes that the patient is experiencing AH, command hallucinations, and is occupying the proch of the [employer].Jessica Foster current diagnosis is Schizophrenia, which she denies, and is being given medication for same while here, which she states she will not take upon d/c. She is currently homeless and unemployed, and while stating her goal is employment, she does not appear overly concerned about neither her homelessness nor her lack of income. She was given resources for both homeless shelters and getting an ID. In the meantime, CSW will attempt to contact "guardian" and ask for accompanying paperwork. While here, Jessica Foster can benefit from crises stabilization, medication management, therapeutic milieu and referral for services at d/c.  Ida Rogue. 08/26/2019

## 2019-08-27 DIAGNOSIS — F209 Schizophrenia, unspecified: Principal | ICD-10-CM

## 2019-08-27 NOTE — Clinical Social Work Note (Signed)
Spoke with Zenovia Jarred, cousin of patient, who is also guardian, along with patient's brother, as of 05/01/19.  She e-mailed me accompanying paperwork which has been put in shadow chart.  She states that, at baseline, patient would be interested in housing, but when experiencing psychosis prefers to be homeless so that she can "preach."  I let her know I gave patient resources for [facility] shelter and [facility] for help with getting ID.  She let me know she had attempted to help patient get ID for purposes of MCD application, but when patient saw that Ms Maryan Char had her birth certificate she became upset and refused to cooperate further.  We talked about the process of applying for disability, and I encouraged her to initiate that process.  Ms Maryan Char understands that at d/c, patient may reject both referrals to shelters and to mental health clinic.  She is hopeful that at some point the patient will decide to seek help on her own and work at her mental health plan.

## 2019-08-27 NOTE — Progress Notes (Signed)
D. Pt presents as anxious, but is calm and cooperative on the unit- appropriate during interactions, although appears to be preoccupied. Pt denies SI/HI and A/VH- does not appear to be responding to internal stimuli. Pt requesting discharge and is under the impression that she would be discharged today. When asked what her plans would be, pt responds "I can go to my brother's".  A. Labs and vitals monitored. Pt compliant with medications. Pt supported emotionally and encouraged to express concerns and ask questions.   R. Pt remains safe with 15 minute checks. Will continue POC.

## 2019-08-27 NOTE — [facility] Suicide Risk Assessment (Signed)
[facility] INPATIENT:  Family/Significant Other Suicide Prevention Education  Suicide Prevention Education:  Education Completed; Zenovia Jarred, cousin and guardian, [phone], [phone] has been identified by the patient as the family member/significant other with whom the patient will be residing, and identified as the person(s) who will aid the patient in the event of a mental health crisis (suicidal ideations/suicide attempt).  With written consent from the patient, the family member/significant other has been provided the following suicide prevention education, prior to the and/or following the discharge of the patient.  The suicide prevention education provided includes the following:  Suicide risk factors  Suicide prevention and interventions  National Suicide Hotline telephone number  [facility] assessment telephone number  [facility] Emergency Assistance 911  [facility] and/or Residential Mobile Crisis Unit telephone number  Request made of family/significant other to:  Remove weapons (e.g., guns, rifles, knives), all items previously/currently identified as safety concern.    Remove drugs/medications (over-the-counter, prescriptions, illicit drugs), all items previously/currently identified as a safety concern.  The family member/significant other verbalizes understanding of the suicide prevention education information provided.  The family member/significant other agrees to remove the items of safety concern listed above.  Baldo Daub [facility] 08/27/2019, 11:27 AM

## 2019-08-27 NOTE — Progress Notes (Signed)
   08/27/19 2100  Psych Admission Type (Psych Patients Only)  Admission Status Involuntary  Psychosocial Assessment  Patient Complaints None  Eye Contact Fair  Facial Expression Other (Comment) (WNL)  Affect Appropriate to circumstance  Speech Logical/coherent  Interaction Assertive  Motor Activity Other (Comment) (WNL)  Appearance/Hygiene Unremarkable  Behavior Characteristics Cooperative  Mood Pleasant  Thought Process  Coherency Circumstantial  Content Preoccupation  Delusions None reported or observed  Perception WDL  Hallucination None reported or observed  Judgment WDL  Confusion None  Danger to Self  Current suicidal ideation? Denies  Danger to Others  Danger to Others None reported or observed

## 2019-08-27 NOTE — Progress Notes (Signed)
[facility] MD Progress Note  08/27/2019 3:24 PM Jessica Foster  MRN:  [id number] Subjective:  Patient reports " I feel like I'm doing OK". Currently denies feeling depressed and does not endorse neuro-vegetative symptoms. She denies hallucinations . She presents future oriented and is focused on discharge planning, reporting that she needs to find a job .   Objective: I have discussed case with treatment team and have met with patient. 34 year old female, currently homeless,  admitted to unit on 7/18,under IVC. Per IVC paperwork: The respondent is talking to the devil. The spirit told her to hurt her guardian with a knife. She believes she must do what the spirit realm tell her to. She is occupying the front porch of the [place] club. Pt is denying any of these allegations. She reports the individual who placed the IVC paperwork is lying and is actually the one who is talking to the devil. Pt denies SI, HI, or AVH. The history is provided bythe patient, the police and medical records." She presented angry and agitated in ED. Patient minimized above and stated that someone was pretending to be her guardian.   Today patient presents alert, attentive, calm, presents euthymic and describes her mood as " fine", although becomes irritable during session with regards to concerns about having a legal guardian.  States " I am an adult. That person has never supported me and I am self sufficient. I do not need a guardian and I need to go to court to have her removed as guardian."  She is focused on being discharged soon and states " I have been here for several days and I am doing fine, I don't need to be here". Today denies hallucinations and at this time does not appear internally preoccupied .  As per staff she has generally presented calm, appropriate during interactions, although ruminative .  At this time denies suicidal ideations, denies homicidal ideations and specifically also denies any violent  ideations towards the person identified as her legal guardian. Denies hallucinations. Does not endorse medication side effects. ( Currently on Zyprexa)  CSW has spoken with Darrold Junker, who is patient's cousin to confirm guardianship and paperwork was placed in shadow chart .    Principal Problem: psychosis Diagnosis: Active Problems:   Schizophrenia (Man)  Total Time spent with patient: 15 minutes  Past Psychiatric History: See admission H&P  Past Medical History:  Past Medical History:  Diagnosis Date  . Obesity    History reviewed. No pertinent surgical history. Family History: History reviewed. No pertinent family history. Family Psychiatric  History: See admission H&P Social History:  Social History   Substance and Sexual Activity  Alcohol Use No     Social History   Substance and Sexual Activity  Drug Use No    Social History   Socioeconomic History  . Marital status: Single    Spouse name: Not on file  . Number of children: Not on file  . Years of education: Not on file  . Highest education level: Not on file  Occupational History  . Not on file  Tobacco Use  . Smoking status: Current Every Day Smoker    Packs/day: 0.50    Years: 3.00    Pack years: 1.50    Types: Cigarettes  . Smokeless tobacco: Never Used  Substance and Sexual Activity  . Alcohol use: No  . Drug use: No  . Sexual activity: Yes    Birth control/protection: Condom  Other Topics  Concern  . Not on file  Social History Narrative  . Not on file   Social Determinants of Health   Financial Resource Strain:   . Difficulty of Paying Living Expenses:   Food Insecurity:   . Worried About Charity fundraiser in the Last Year:   . Arboriculturist in the Last Year:   Transportation Needs:   . Film/video editor (Medical):   Marland Kitchen Lack of Transportation (Non-Medical):   Physical Activity:   . Days of Exercise per Week:   . Minutes of Exercise per Session:   Stress:   . Feeling of  Stress :   Social Connections:   . Frequency of Communication with Friends and Family:   . Frequency of Social Gatherings with Friends and Family:   . Attends Religious Services:   . Active Member of Clubs or Organizations:   . Attends Archivist Meetings:   Marland Kitchen Marital Status:    Additional Social History:    Sleep: Good  Appetite:  Good  Current Medications: Current Facility-Administered Medications  Medication Dose Route Frequency Provider Last Rate Last Admin  . acetaminophen (TYLENOL) tablet 650 mg  650 mg Oral Q6H PRN Lindon Romp A, NP      . alum & mag hydroxide-simeth (MAALOX/MYLANTA) 200-200-20 MG/5ML suspension 30 mL  30 mL Oral Q4H PRN Lindon Romp A, NP      . folic acid (FOLVITE) tablet 1 mg  1 mg Oral Daily Sharma Covert, MD   1 mg at 08/27/19 1610  . hydrochlorothiazide (MICROZIDE) capsule 12.5 mg  12.5 mg Oral Daily Sharma Covert, MD   12.5 mg at 08/27/19 9604  . hydrOXYzine (ATARAX/VISTARIL) tablet 25 mg  25 mg Oral TID PRN Lindon Romp A, NP      . ziprasidone (GEODON) injection 10 mg  10 mg Intramuscular Q12H PRN Rozetta Nunnery, NP       And  . LORazepam (ATIVAN) tablet 1 mg  1 mg Oral PRN Lindon Romp A, NP      . magnesium hydroxide (MILK OF MAGNESIA) suspension 30 mL  30 mL Oral Daily PRN Lindon Romp A, NP      . multivitamins with iron tablet 1 tablet  1 tablet Oral Daily Sharma Covert, MD   1 tablet at 08/27/19 [phone]  . OLANZapine zydis (ZYPREXA) disintegrating tablet 10 mg  10 mg Oral QHS Sharma Covert, MD   10 mg at 08/26/19 2047  . thiamine tablet 100 mg  100 mg Oral Daily Sharma Covert, MD   100 mg at 08/27/19 0819  . traZODone (DESYREL) tablet 50 mg  50 mg Oral QHS PRN Rozetta Nunnery, NP        Lab Results:  No results found for this or any previous visit (from the past 48 hour(s)).  Blood Alcohol level:  Lab Results  Component Value Date   ETH <10 81/19/1478    Metabolic Disorder Labs: Lab Results  Component  Value Date   HGBA1C 5.6 08/24/2019   MPG 114.02 08/24/2019   No results found for: PROLACTIN Lab Results  Component Value Date   CHOL 145 08/24/2019   TRIG 49 08/24/2019   HDL 46 08/24/2019   CHOLHDL 3.2 08/24/2019   VLDL 10 08/24/2019   LDLCALC 89 08/24/2019    Physical Findings: AIMS:  , ,  ,  ,    CIWA:    COWS:     Musculoskeletal: Strength &  Muscle Tone: within normal limits Gait & Station: normal Patient leans: N/A  Psychiatric Specialty Exam: Physical Exam Vitals and nursing note reviewed.  Constitutional:      Appearance: She is well-developed.  Cardiovascular:     Rate and Rhythm: Normal rate.  Pulmonary:     Effort: Pulmonary effort is normal.  Neurological:     Mental Status: She is alert and oriented to person, place, and time.     Review of Systems  Constitutional: Negative.   Respiratory: Negative for cough and shortness of breath.   Psychiatric/Behavioral: Negative for agitation, behavioral problems, confusion, dysphoric mood, hallucinations, self-injury, sleep disturbance and suicidal ideas. The patient is not nervous/anxious and is not hyperactive.   denies headache, no chest pain, no shortness of breath, no vomiting  Blood pressure (!) 132/91, pulse 95, temperature (!) 97.4 F (36.3 C), temperature source Oral, resp. rate 20, height '5\' 2"'  (1.575 m), weight (!) 140.6 kg, last menstrual period 08/16/2019, SpO2 98 %.Body mass index is 56.69 kg/m.  General Appearance: Casual  Eye Contact:  Good  Speech:  Normal Rate  Volume:  Normal  Mood:  Euthymic, denies feeling depressed   Affect:  Appropriate and at times somewhat irritable , labile, particularly when expressing her frustration related to legal guardianship  Thought Process:  Linear and Descriptions of Associations: Intact generally linear thought process  Orientation:  Full (Time, Place, and Person)  Thought Content:  denies hallucinations, does not appear internally preoccupied, no overt  delusions are expressed at this time  Suicidal Thoughts:  No denies SI or self injurious ideations, denies homicidal or violent ideations  Homicidal Thoughts:  No  Memory:  recent and remote grossly intact   Judgement:  Fair/ improving   Insight:  Limited   Psychomotor Activity:  Normal- no psychomotor agitation  Concentration:  Concentration: Good and Attention Span: Good  Recall:  Good  Fund of Knowledge:  Good  Language:  Good  Akathisia:  No  Handed:  Right  AIMS (if indicated):     Assets:  Communication Skills Desire for Improvement Leisure Time  ADL's:  Intact  Cognition:  WNL  Sleep:  Number of Hours: 8.75   Assessment-  34 year old female, currently homeless,  admitted to unit on 7/18,under IVC. Per IVC paperwork: The respondent is talking to the devil. The spirit told her to hurt her guardian with a knife. She believes she must do what the spirit realm tell her to. She is occupying the front porch of the [employer]. Pt is denying any of these allegations. She reports the individual who placed the IVC paperwork is lying and is actually the one who is talking to the devil. Pt denies SI, HI, or AVH. The history is provided bythe patient, the police and medical records." She presented angry and agitated in ED. Patient minimized above and stated that someone was pretending to be her guardian.   Today patient reports feeling well and is focused on being discharged. She expresses frustration about legal guardianship ( which was confirmed by CSW- Ms. Darrold Junker identified as guardian) and states she is an adult and feels she can function independently without guardianship. At this time denies hallucinations, does not appear internally preoccupied, denies SI or  any violent or homicidal ideations towards legal guardian or anyone else . Although generally presents euthymic, she becomes irritable/ loud ( but behavior remains in good control)  when broaching guardianship issues  as above . Denies medication side effects.  Treatment Plan Summary: Daily contact with patient to assess and evaluate symptoms and progress in treatment and Medication management  Treatment Plan reviewed as below today 7/21 Continue Zyprexa 10 mg PO QHS for psychosis Continue HCTZ 12.5 mg PO daily for HTN Continue folic acid 1 mg PO daily for supplementation Continue thiamine 100 mg PO daily for supplementation Continue Vistaril 25 mg PO TID PRN anxiety Continue Trazodone 50 mg PO QHS PRN insomnia Continue agitation protocol PRN agitation ( * 7/16 EKG QTc 439)  Treatment team working on disposition planning options.   Jenne Campus, MD 08/27/2019, 3:24 PM   Patient ID: Jessica Foster, female   DOB: [birth year], 34 y.o.   MRN: [id number]

## 2019-08-28 MED ORDER — HYDROCHLOROTHIAZIDE 12.5 MG PO CAPS: 12.5000 mg | ORAL_CAPSULE | Freq: Every day | ORAL | 0 refills | Status: DC

## 2019-08-28 MED ORDER — HYDROXYZINE HCL 25 MG PO TABS: 25.0000 mg | ORAL_TABLET | Freq: Three times a day (TID) | ORAL | 0 refills | Status: DC | PRN

## 2019-08-28 MED ORDER — FOLIC ACID 1 MG PO TABS: 1.0000 mg | ORAL_TABLET | Freq: Every day | ORAL | 0 refills | Status: DC

## 2019-08-28 MED ORDER — OLANZAPINE 10 MG PO TBDP: 10.0000 mg | ORAL_TABLET | Freq: Every day | ORAL | 0 refills | Status: DC

## 2019-08-28 MED ORDER — TRAZODONE HCL 50 MG PO TABS: 50.0000 mg | ORAL_TABLET | Freq: Every evening | ORAL | 0 refills | Status: DC | PRN

## 2019-08-28 NOTE — B[facility] Suicide Risk Assessment (Signed)
[facility] Discharge Suicide Risk Assessment   Principal Problem: schizophrenia by history Discharge Diagnoses: Active Problems:   Schizophrenia (HCC)   Total Time spent with patient: 30 minutes  Musculoskeletal: Strength & Muscle Tone: within normal limits Gait & Station: normal Patient leans: N/A  Psychiatric Specialty Exam: Review of Systems denies headache, no chest pain, no shortness of breath, no vomiting , no fever or chills   Blood pressure (!) 132/91, pulse 95, temperature (!) 97.4 F (36.3 C), temperature source Oral, resp. rate 20, height 5\' 2"  (1.575 m), weight (!) 140.6 kg, last menstrual period 08/16/2019, SpO2 98 %.Body mass index is 56.69 kg/m.  General Appearance: improving grooming   Eye Contact::  Good  Speech:  Normal Rate409  Volume:  Normal  Mood:  reports her mood is "OK", denies feeling depressed   Affect:  more reactive, not irritable today  Thought Process:  Linear and Descriptions of Associations: Intact- today presents with organized thought process   Orientation:  Full (Time, Place, and Person)  Thought Content:  denies hallucinations, no delusions are expressed, does not appear internally preoccupied   Suicidal Thoughts:  No denies suicidal or self injurious ideations, denies homicidal or violent ideations   Homicidal Thoughts:  No  Memory:  recent and remote grossly intact   Judgement:  Fair/ improving   Insight:  Fair  Psychomotor Activity:  Normal- no restlessness or agitation    Concentration:  Good  Recall:  Good  Fund of Knowledge:Good  Language: Good  Akathisia:  Negative  Handed:  Right  AIMS (if indicated):     Assets:  Communication Skills Desire for Improvement Resilience  Sleep:  Number of Hours: 8  Cognition: WNL  ADL's:  Intact   Mental Status Per Nursing Assessment::   On Admission:  NA  Demographic Factors:  34 year old female  Loss Factors: Homelessness, history of mental illness   Historical Factors: Family members  reported prior hospitalizations and past history of schizophrenia diagnosis  Risk Reduction Factors:   Positive coping skills or problem solving skills  Continued Clinical Symptoms:  Today patient presents alert, attentive, calm, without psychomotor agitation,describes mood as " all right" and denies feeling depressed. Affect is more reactive and today does not present irritable. Thought process presents linear and better organized, no suicidal or self injurious ideations, no homicidal or violent ideations, denies hallucinations and does not appear internally preoccupied, no delusions are currently expressed . With her express consent I spoke with Ms. Fludd , who is identified as patient's legal guardian. Ms. 19 corroborates patient is improved and has no concerns about discharge today. She also states that patient has been homeless by choice as she knows that she can go stay with her brother or with other family members . Of note, today patient is reporting she is considering staying with her brother Maryan Char) for a period of time Denies medication side effects. Side effects reviewed , to include potential risk of metabolic disorders, motor side effects, sedation  Cognitive Features That Contribute To Risk:  No gross cognitive deficits noted upon discharge. Is alert , attentive, and oriented x 3    Suicide Risk:  Mild:  Suicidal ideation of limited frequency, intensity, duration, and specificity.  There are no identifiable plans, no associated intent, mild dysphoria and related symptoms, good self-control (both objective and subjective assessment), few other risk factors, and identifiable protective factors, including available and accessible social support.   Follow-up Information    [place].   Specialty: Professional Counselor Why: Please go to this provider during their walk in hours: Monday through Friday from 8:30 am to 12:00 pm and 1:30 pm to 2:30 pm,  if you wish to receive medication management and therapy services. Contact information: [address] [place] [address] [phone]               Plan Of Care/Follow-up recommendations:  Activity:  as tolerated  Diet:  regular Tests:  NA Other:  See below  Patient is expressing readiness for discharge. Discharge plan has been reviewed with patient's identified legal guardian, Ms. Maryan Char Plans to stay with her brother. Plans to follow up as above .  Craige Cotta, MD 08/28/2019, 11:39 AM

## 2019-08-28 NOTE — Plan of Care (Signed)
Pt attended half of recreation therapy group sessions without prompting or encouragement at completion of recreation therapy group sessions.    Caroll Rancher, LRT/CTRS

## 2019-08-28 NOTE — Progress Notes (Signed)
Recreation Therapy Notes  Date: 7.22.21 Time: 1015 Location: [address] Dayroom  Group Topic: Triggers  Goal Area(s) Addresses:  Patient will identify biggest triggers. Patient will identify how to avoid triggers. Patient will identify how to deal with triggers head on.  Intervention: Worksheet, pencils  Activity: Triggers.  Patients were to identify their 3 biggest triggers.  Patients were to then identify three ways in which those triggers could be avoided and three ways they can be dealt with head on.  Education: Triggers, Discharge Planning  Education Outcome: Acknowledges understanding/In group clarification offered/Needs additional education.   Clinical Observations/Feedback:  Pt did not attend.    Caroll Rancher, LRT/CTRS         Caroll Rancher A 08/28/2019 11:17 AM

## 2019-08-28 NOTE — Progress Notes (Signed)
Recreation Therapy Notes  INPATIENT RECREATION TR PLAN  Patient Details Name: Jessica Foster MRN: [id number] DOB: Jul 21, 1985 Today's Date: 08/28/2019  Rec Therapy Plan Is patient appropriate for Therapeutic Recreation?: Yes Treatment times per week: about 3 days Estimated Length of Stay: 5-7 days TR Treatment/Interventions: Group participation (Comment)  Discharge Criteria Pt will be discharged from therapy if:: Discharged Treatment plan/goals/alternatives discussed and agreed upon by:: Patient/family  Discharge Summary Short term goals set: See patient care plan Short term goals met: Adequate for discharge Progress toward goals comments: Groups attended Which groups?: Coping skills Reason goals not met: None Therapeutic equipment acquired: N/A Reason patient discharged from therapy: Discharge from hospital Pt/family agrees with progress & goals achieved: Yes Date patient discharged from therapy: 08/28/19    Victorino Sparrow, LRT/CTRS  Ria Comment, Amoura Ransier A 08/28/2019, 11:23 AM

## 2019-08-28 NOTE — BHH Counselor (Signed)
CSW spoke with this patients guardian, Zenovia Jarred at [phone]) who stated that she had no concerns with this patient discharging on this date.    Ruthann Cancer MSW, LCSW Clincal Social Worker  [facility]

## 2019-08-28 NOTE — Progress Notes (Signed)
Pt discharged to lobby. Pt was stable and appreciative at that time. All papers, samples and prescriptions were given and valuables returned. Verbal understanding expressed. Denies SI/HI and A/VH. Pt given opportunity to express concerns and ask questions.  

## 2019-08-28 NOTE — Discharge Summary (Addendum)
Physician Discharge Summary Note  Patient:  Jessica Foster is an 34 y.o., female MRN:  [id number] DOB:  December 29, 1985 Patient phone:  [phone] (home)  Patient address:   [place] [address],  Total Time spent with patient: Greater than 30 minutes  Date of Admission:  08/23/2019 Date of Discharge: 08-28-19  Reason for Admission:  Worsening psychosis.  Principal Problem: Psychotic disorder [facility])  Discharge Diagnoses: Principal Problem:   Psychotic disorder (HCC) Active Problems:   Schizophrenia (HCC)  Past Psychiatric History: See H&P  Past Medical History:  Past Medical History:  Diagnosis Date  . Obesity    History reviewed. No pertinent surgical history.  Family History: History reviewed. No pertinent family history.  Family Psychiatric  History: See H&P  Social History:  Social History   Substance and Sexual Activity  Alcohol Use No     Social History   Substance and Sexual Activity  Drug Use No    Social History   Socioeconomic History  . Marital status: Single    Spouse name: Not on file  . Number of children: Not on file  . Years of education: Not on file  . Highest education level: Not on file  Occupational History  . Not on file  Tobacco Use  . Smoking status: Current Every Day Smoker    Packs/day: 0.50    Years: 3.00    Pack years: 1.50    Types: Cigarettes  . Smokeless tobacco: Never Used  Substance and Sexual Activity  . Alcohol use: No  . Drug use: No  . Sexual activity: Yes    Birth control/protection: Condom  Other Topics Concern  . Not on file  Social History Narrative  . Not on file   Social Determinants of Health   Financial Resource Strain:   . Difficulty of Paying Living Expenses:   Food Insecurity:   . Worried About Programme researcher, broadcasting/film/video in the Last Year:   . Barista in the Last Year:   Transportation Needs:   . Freight forwarder (Medical):   Marland Kitchen Lack of Transportation (Non-Medical):   Physical  Activity:   . Days of Exercise per Week:   . Minutes of Exercise per Session:   Stress:   . Feeling of Stress :   Social Connections:   . Frequency of Communication with Friends and Family:   . Frequency of Social Gatherings with Friends and Family:   . Attends Religious Services:   . Active Member of Clubs or Organizations:   . Attends Banker Meetings:   Marland Kitchen Marital Status:    Hospital Course: (Per Md's admission evaluation notes):  Patient is seen and examined. Patient is a 34 year old female who originally presented to the [facility] emergency department on 08/22/2019 under involuntary commitment. The patient stated that someone is pretending to be her guardian, and it be involuntarily committed her. She stated she is unsure why, but believes that "the devil is involved with this". She stated that the person who claims to be her guardian is a family member, but "she is not my guardian". Patient stated that currently she is living on the street. She denied any family history of any psychiatric illness. She denied any personal history of psychiatric illness. Review of the electronic medical record revealed an evaluation at the behavioral health hospital back on 12/30/2018. She had presented at that time under involuntary commitment secondary to paranoia. At that time she stated that her church  family is "a cult".She was evaluated and did not feel needed to be admitted to the hospital emergently. She did not fulfill criteria for involuntary commitment. She refused outpatient resources and was discharged. On evaluation today she denied any suicidal or homicidal ideation. She does appear to be paranoid and had some delusions. She denied any suicidal or homicidal ideation. Collateral information obtained from the emergency department revealed that the patient's legal guardian was contacted. The guardian stated that the patient had a history of schizophrenia  and had been off her medications for over a year. She had not seen a psychiatrist since January 2021 due to her refusing to see anyone. She was last seen at [facility], and is currently homeless and refuses to get help. Regarding stated the patient was not a harm to herself but threatened her a few times to "get the bad spirits out". She was started on Zyprexa 5 mg p.o. nightly to the emergency room. She was admitted to the hospital for evaluation and stabilization.  After evaluation of her presenting symptoms, Akaiya was recommended for mood stabilization treatments. The medication regimen for her presenting symptoms were discussed & with her consent initiated. She received, stabilized & was discharged on the medications as listed below on her discharge medication lists. She was also enrolled & participated in the group counseling sessions being offered & held on this unit. She learned coping skills. She presented on this admission, other significant pre-existing medical conditions that required treatment & monitoring. She was resumed, treated & discharged on all her pertinent home medications for those health issues. She tolerated her treatment regimen without any adverse effects or reactions reported.   During the course of her hospitalization, the 15-minute checks were adequate to ensure Harmoney's safety. Patient did not display any dangerous, violent or suicidal behavior on the unit.  She interacted with patients & staff appropriately. She participated appropriately in the group sessions/therapies. Her medications were addressed & adjusted to meet her needs. She was recommended for outpatient follow-up care & medication management upon discharge to assure her continuity of care.  At the time of discharge patient is not reporting any acute suicidal/homicidal ideations. There were no reports of psychosis or delusional thinking. She feels more confident about her self & mental health care. She currently denies  any new issues or concerns. Education and supportive counseling provided throughout her hospital stay & upon discharge.   Today upon her discharge evaluation with the attending psychiatrist, Doratha shares she is doing well. She denies any other specific concerns. She is sleeping well. Her appetite is good. She denies other physical complaints. She denies AH/VH. She feels that her medications have been helpful & is in agreement to continue her current treatment regimen as recommended. She was able to engage in safety planning including plan to return to [facility] or contact emergency services if she feels unable to maintain her own safety or the safety of others. Pt had no further questions, comments, or concerns. She left [facility] with all personal belongings in no apparent distress. Transportation per the city bus. [facility] assisted with bus pass.  Physical Findings: AIMS:  , ,  ,  ,    CIWA:    COWS:     Musculoskeletal: Strength & Muscle Tone: within normal limits Gait & Station: normal Patient leans: N/A  Psychiatric Specialty Exam: Physical Exam Vitals and nursing note reviewed.  HENT:     Head: Normocephalic.     Nose: Nose normal.     Mouth/Throat:  Pharynx: Oropharynx is clear.  Eyes:     Pupils: Pupils are equal, round, and reactive to light.  Cardiovascular:     Pulses: Normal pulses.  Pulmonary:     Effort: Pulmonary effort is normal.  Genitourinary:    Comments: Deferred Musculoskeletal:        General: Normal range of motion.     Cervical back: Normal range of motion.  Skin:    General: Skin is warm and dry.  Neurological:     Mental Status: She is alert and oriented to person, place, and time.     Review of Systems  Constitutional: Negative for diaphoresis, fever and unexpected weight change.  HENT: Negative.   Eyes: Negative.   Respiratory: Negative.   Cardiovascular: Negative.   Gastrointestinal: Negative.   Endocrine: Negative.   Genitourinary: Negative.    Musculoskeletal: Negative.   Allergic/Immunologic: Negative for environmental allergies and food allergies.       Allergies: NKDA  Neurological: Negative.   Psychiatric/Behavioral: Positive for dysphoric mood (Stabilized with medication prior to discharge), hallucinations (Hx. psychosis (Stabilized with medication prior to discharge).) and sleep disturbance (Stabilized with medication prior to discharge). Negative for agitation, behavioral problems, confusion, decreased concentration, self-injury and suicidal ideas. The patient is not nervous/anxious (Stable upon discharge) and is not hyperactive.     Blood pressure (!) 132/91, pulse 95, temperature (!) 97.4 F (36.3 C), temperature source Oral, resp. rate 20, height 5\' 2"  (1.575 m), weight (!) 140.6 kg, last menstrual period 08/16/2019, SpO2 98 %.Body mass index is 56.69 kg/m.  See Md's discharge SRA  Sleep:  Number of Hours: 8   Have you used any form of tobacco in the last 30 days? (Cigarettes, Smokeless Tobacco, Cigars, and/or Pipes): Yes  Has this patient used any form of tobacco in the last 30 days? (Cigarettes,  Smokeless Tobacco, Cigars, and/or Pipes): N/A  Blood Alcohol level:  Lab Results  Component Value Date   ETH <10 08/22/2019   Metabolic Disorder Labs:  Lab Results  Component Value Date   HGBA1C 5.6 08/24/2019   MPG 114.02 08/24/2019   No results found for: PROLACTIN Lab Results  Component Value Date   CHOL 145 08/24/2019   TRIG 49 08/24/2019   HDL 46 08/24/2019   CHOLHDL 3.2 08/24/2019   VLDL 10 08/24/2019   LDLCALC 89 08/24/2019   See Psychiatric Specialty Exam and Suicide Risk Assessment completed by Attending Physician prior to discharge.  Discharge destination:  Home  Is patient on multiple antipsychotic therapies at discharge:  No   Has Patient had three or more failed trials of antipsychotic monotherapy by history:  No  Recommended Plan for Multiple Antipsychotic Therapies: NA  Allergies as of  08/28/2019   No Known Allergies     Medication List    STOP taking these medications   ferrous sulfate 325 (65 FE) MG tablet     TAKE these medications     Indication  folic acid 1 MG tablet Commonly known as: FOLVITE Take 1 tablet (1 mg total) by mouth daily. (May buy from over the counter): For Folate supplementation Start taking on: August 29, 2019  Indication: Anemia From Inadequate Folic Acid   hydrochlorothiazide 12.5 MG capsule Commonly known as: MICROZIDE Take 1 capsule (12.5 mg total) by mouth daily. For hypertension Start taking on: August 29, 2019  Indication: High Blood Pressure Disorder   hydrOXYzine 25 MG tablet Commonly known as: ATARAX/VISTARIL Take 1 tablet (25 mg total) by mouth 3 (three) times  daily as needed for anxiety.  Indication: Feeling Anxious   OLANZapine zydis 10 MG disintegrating tablet Commonly known as: ZYPREXA Take 1 tablet (10 mg total) by mouth at bedtime. For mood control  Indication: Mood control   traZODone 50 MG tablet Commonly known as: DESYREL Take 1 tablet (50 mg total) by mouth at bedtime as needed for sleep.  Indication: Trouble Sleeping       Follow-up Information    [facility] Follow up.   Specialty: Professional Counselor Why: Please go to this provider during their walk in hours: Monday through Friday from 8:30 am to 12:00 pm and 1:30 pm to 2:30 pm, if you wish to receive medication management and therapy services. Contact information: [address] [place] [id number] [phone]              Follow-up recommendations: Activity:  As tolerated Diet: As recommended by your primary care doctor. Keep all scheduled follow-up appointments as recommended.   Comments: Prescriptions given at discharge.  Patient agreeable to plan.  Given opportunity to ask questions.  Appears to feel comfortable with discharge denies any current suicidal or homicidal thought. Patient is also instructed  prior to discharge to: Take all medications as prescribed by his/her mental healthcare provider. Report any adverse effects and or reactions from the medicines to his/her outpatient provider promptly. Patient has been instructed & cautioned: To not engage in alcohol and or illegal drug use while on prescription medicines. In the event of worsening symptoms, patient is instructed to call the crisis hotline, 911 and or go to the nearest ED for appropriate evaluation and treatment of symptoms. To follow-up with his/her primary care provider for your other medical issues, concerns and or health care needs.  Signed: Armandina StammerAgnes Nwoko, NP, PMHNP, FNP-BC 08/28/2019, 1:43 PM   Patient seen, Suicide Assessment Completed.  Disposition Plan Reviewed

## 2019-08-28 NOTE — Progress Notes (Signed)
  [facility] Adult Case Management Discharge Plan :  Will you be returning to the same living situation after discharge:  No. Will be discharging to a shelter. At discharge, do you have transportation home?: No. Bus fare will be provided.  Do you have the ability to pay for your medications: No. Samples to be provided.  Release of information consent forms completed and in the chart;  Patient's signature needed at discharge.  Patient to Follow up at:  Follow-up Information    [facility] Follow up.   Specialty: Professional Counselor Why: Please go to this provider during their walk in hours: Monday through Friday from 8:30 am to 12:00 pm and 1:30 pm to 2:30 pm, if you wish to receive medication management and therapy services. Contact information: [address] [place] [address] [phone]               Next level of care provider has access to [facility] Link:no  Safety Planning and Suicide Prevention discussed: Yes,  with patient.  Have you used any form of tobacco in the last 30 days? (Cigarettes, Smokeless Tobacco, Cigars, and/or Pipes): Yes  Has patient been referred to the Quitline?: Patient refused referral  Patient has been referred for addiction treatment: Pt. refused referral  Otelia Santee, LCSW 08/28/2019, 10:11 AM

## 2020-10-02 ENCOUNTER — Observation Stay (HOSPITAL_COMMUNITY)
Admission: EM | Admit: 2020-10-02 | Discharge: 2020-10-04 | Disposition: A | Discharge status: Home / Self Care | Payer: Medicaid Other | Attending: Internal Medicine | Admitting: Internal Medicine

## 2020-10-02 ENCOUNTER — Emergency Department (HOSPITAL_COMMUNITY): Payer: Medicaid Other

## 2020-10-02 ENCOUNTER — Encounter (HOSPITAL_COMMUNITY): Payer: Self-pay | Admitting: Emergency Medicine

## 2020-10-02 ENCOUNTER — Other Ambulatory Visit: Payer: Self-pay

## 2020-10-02 DIAGNOSIS — Z79899 Other long term (current) drug therapy: Secondary | ICD-10-CM | POA: Insufficient documentation

## 2020-10-02 DIAGNOSIS — J189 Pneumonia, unspecified organism: Principal | ICD-10-CM

## 2020-10-02 DIAGNOSIS — F17210 Nicotine dependence, cigarettes, uncomplicated: Secondary | ICD-10-CM | POA: Diagnosis not present

## 2020-10-02 DIAGNOSIS — Z20822 Contact with and (suspected) exposure to covid-19: Secondary | ICD-10-CM | POA: Insufficient documentation

## 2020-10-02 DIAGNOSIS — R0602 Shortness of breath: Secondary | ICD-10-CM | POA: Diagnosis not present

## 2020-10-02 DIAGNOSIS — Z28310 Unvaccinated for covid-19: Secondary | ICD-10-CM | POA: Insufficient documentation

## 2020-10-02 DIAGNOSIS — I10 Essential (primary) hypertension: Secondary | ICD-10-CM | POA: Insufficient documentation

## 2020-10-02 LAB — CBC WITH DIFFERENTIAL/PLATELET
Abs Immature Granulocytes: 0.08 10*3/uL — ABNORMAL HIGH (ref 0.00–0.07)
Basophils Absolute: 0 10*3/uL (ref 0.0–0.1)
Basophils Relative: 0 %
Eosinophils Absolute: 1.5 10*3/uL — ABNORMAL HIGH (ref 0.0–0.5)
Eosinophils Relative: 11 %
HCT: 30.5 % — ABNORMAL LOW (ref 36.0–46.0)
Hemoglobin: 9.3 g/dL — ABNORMAL LOW (ref 12.0–15.0)
Immature Granulocytes: 1 %
Lymphocytes Relative: 12 %
Lymphs Abs: 1.7 10*3/uL (ref 0.7–4.0)
MCH: 23.7 pg — ABNORMAL LOW (ref 26.0–34.0)
MCHC: 30.5 g/dL (ref 30.0–36.0)
MCV: 77.8 fL — ABNORMAL LOW (ref 80.0–100.0)
Monocytes Absolute: 0.8 10*3/uL (ref 0.1–1.0)
Monocytes Relative: 6 %
Neutro Abs: 9.9 10*3/uL — ABNORMAL HIGH (ref 1.7–7.7)
Neutrophils Relative %: 70 %
Platelets: 396 10*3/uL (ref 150–400)
RBC: 3.92 MIL/uL (ref 3.87–5.11)
RDW: 15 % (ref 11.5–15.5)
WBC: 13.9 10*3/uL — ABNORMAL HIGH (ref 4.0–10.5)
nRBC: 0 % (ref 0.0–0.2)

## 2020-10-02 LAB — COMPREHENSIVE METABOLIC PANEL
ALT: 28 U/L (ref 0–44)
AST: 36 U/L (ref 15–41)
Albumin: 3.1 g/dL — ABNORMAL LOW (ref 3.5–5.0)
Alkaline Phosphatase: 66 U/L (ref 38–126)
Anion gap: 9 (ref 5–15)
BUN: 6 mg/dL (ref 6–20)
CO2: 31 mmol/L (ref 22–32)
Calcium: 8.7 mg/dL — ABNORMAL LOW (ref 8.9–10.3)
Chloride: 97 mmol/L — ABNORMAL LOW (ref 98–111)
Creatinine, Ser: 0.81 mg/dL (ref 0.44–1.00)
GFR, Estimated: >60 mL/min (ref >60)
Glucose, Bld: 107 mg/dL — ABNORMAL HIGH (ref 70–99)
Potassium: 3.8 mmol/L (ref 3.5–5.1)
Sodium: 137 mmol/L (ref 135–145)
Total Bilirubin: 0.5 mg/dL (ref 0.3–1.2)
Total Protein: 6.8 g/dL (ref 6.5–8.1)

## 2020-10-02 LAB — LACTIC ACID, PLASMA: Lactic Acid, Venous: 0.8 mmol/L (ref 0.5–1.9)

## 2020-10-02 LAB — I-STAT BETA HCG BLOOD, ED (MC, WL, AP ONLY): I-stat hCG, quantitative: 6 m[IU]/mL — ABNORMAL HIGH (ref <5)

## 2020-10-02 LAB — RESP PANEL BY RT-PCR (FLU A&B, COVID) ARPGX2
Influenza A by PCR: NEGATIVE
Influenza B by PCR: NEGATIVE
SARS Coronavirus 2 by RT PCR: NEGATIVE

## 2020-10-02 LAB — BRAIN NATRIURETIC PEPTIDE: B Natriuretic Peptide: 20.1 pg/mL (ref 0.0–100.0)

## 2020-10-02 LAB — HIV ANTIBODY (ROUTINE TESTING W REFLEX): HIV Screen 4th Generation wRfx: NONREACTIVE

## 2020-10-02 LAB — TROPONIN I (HIGH SENSITIVITY): Troponin I (High Sensitivity): 11 ng/L (ref <18)

## 2020-10-02 MED ORDER — TRAZODONE HCL 50 MG PO TABS
50.0000 mg | ORAL_TABLET | Freq: Every evening | ORAL | Status: DC | PRN
Start: 2020-10-02 — End: 2020-10-04

## 2020-10-02 MED ORDER — ALBUTEROL SULFATE HFA 108 (90 BASE) MCG/ACT IN AERS
1.0000 | INHALATION_SPRAY | Freq: Four times a day (QID) | RESPIRATORY_TRACT | Status: DC | PRN
  Filled 2020-10-02: qty 6.7

## 2020-10-02 MED ORDER — FOLIC ACID 1 MG PO TABS
1.0000 mg | ORAL_TABLET | Freq: Every day | ORAL | Status: DC
  Administered 2020-10-02 – 2020-10-04 (×3): 1 mg via ORAL
  Filled 2020-10-02 (×3): qty 1

## 2020-10-02 MED ORDER — LACTATED RINGERS IV BOLUS (SEPSIS)
1000.0000 mL | Freq: Once | INTRAVENOUS | Status: AC
  Administered 2020-10-02: 1000 mL via INTRAVENOUS

## 2020-10-02 MED ORDER — CEFTRIAXONE SODIUM 2 G IJ SOLR
2.0000 g | INTRAMUSCULAR | Status: DC
  Administered 2020-10-02: 2 g via INTRAVENOUS
  Filled 2020-10-02: qty 20

## 2020-10-02 MED ORDER — MAGNESIUM SULFATE 2 GM/50ML IV SOLN
2.0000 g | Freq: Once | INTRAVENOUS | Status: AC
  Administered 2020-10-02: 2 g via INTRAVENOUS
  Filled 2020-10-02: qty 50

## 2020-10-02 MED ORDER — AZITHROMYCIN 250 MG PO TABS: 500.0000 mg | ORAL_TABLET | Freq: Every day | ORAL | Status: DC

## 2020-10-02 MED ORDER — BENZONATATE 100 MG PO CAPS
200.0000 mg | ORAL_CAPSULE | Freq: Two times a day (BID) | ORAL | Status: DC
  Administered 2020-10-02 – 2020-10-04 (×4): 200 mg via ORAL
  Filled 2020-10-02 (×5): qty 2

## 2020-10-02 MED ORDER — LACTATED RINGERS IV SOLN: INTRAVENOUS | Status: DC

## 2020-10-02 MED ORDER — SODIUM CHLORIDE 0.9 % IV SOLN: 2.0000 g | INTRAVENOUS | Status: DC

## 2020-10-02 MED ORDER — SODIUM CHLORIDE 0.9 % IV SOLN
500.0000 mg | INTRAVENOUS | Status: DC
  Administered 2020-10-02: 500 mg via INTRAVENOUS
  Filled 2020-10-02 (×2): qty 500

## 2020-10-02 MED ORDER — IPRATROPIUM-ALBUTEROL 0.5-2.5 (3) MG/3ML IN SOLN
3.0000 mL | Freq: Four times a day (QID) | RESPIRATORY_TRACT | Status: DC
  Administered 2020-10-02 – 2020-10-04 (×7): 3 mL via RESPIRATORY_TRACT
  Filled 2020-10-02 (×8): qty 3

## 2020-10-02 MED ORDER — GUAIFENESIN ER 600 MG PO TB12
1200.0000 mg | ORAL_TABLET | Freq: Two times a day (BID) | ORAL | Status: DC
  Administered 2020-10-02 – 2020-10-04 (×4): 1200 mg via ORAL
  Filled 2020-10-02 (×4): qty 2

## 2020-10-02 MED ORDER — ENOXAPARIN SODIUM 40 MG/0.4ML IJ SOSY
40.0000 mg | PREFILLED_SYRINGE | INTRAMUSCULAR | Status: DC
  Administered 2020-10-02 – 2020-10-03 (×2): 40 mg via SUBCUTANEOUS
  Filled 2020-10-02 (×2): qty 0.4

## 2020-10-02 MED ORDER — OLANZAPINE 5 MG PO TBDP
10.0000 mg | ORAL_TABLET | Freq: Every day | ORAL | Status: DC
  Administered 2020-10-02: 10 mg via ORAL
  Filled 2020-10-02 (×2): qty 2

## 2020-10-02 MED ORDER — ALBUTEROL SULFATE (2.5 MG/3ML) 0.083% IN NEBU: 2.5000 mg | INHALATION_SOLUTION | RESPIRATORY_TRACT | Status: DC | PRN

## 2020-10-02 MED ORDER — PREDNISONE 20 MG PO TABS
40.0000 mg | ORAL_TABLET | Freq: Every day | ORAL | Status: DC
  Administered 2020-10-03 – 2020-10-04 (×2): 40 mg via ORAL
  Filled 2020-10-02 (×2): qty 2

## 2020-10-02 MED ORDER — HYDROCHLOROTHIAZIDE 12.5 MG PO CAPS
12.5000 mg | ORAL_CAPSULE | Freq: Every day | ORAL | Status: DC
  Administered 2020-10-02 – 2020-10-04 (×3): 12.5 mg via ORAL
  Filled 2020-10-02 (×3): qty 1

## 2020-10-02 MED ORDER — ACETAMINOPHEN 325 MG PO TABS
650.0000 mg | ORAL_TABLET | Freq: Four times a day (QID) | ORAL | Status: DC | PRN
  Administered 2020-10-02: 650 mg via ORAL
  Filled 2020-10-02: qty 2

## 2020-10-02 MED ORDER — METHYLPREDNISOLONE SODIUM SUCC 125 MG IJ SOLR
125.0000 mg | Freq: Once | INTRAMUSCULAR | Status: AC
  Administered 2020-10-02: 125 mg via INTRAVENOUS
  Filled 2020-10-02: qty 2

## 2020-10-02 MED ORDER — HYDROXYZINE HCL 25 MG PO TABS: 25.0000 mg | ORAL_TABLET | Freq: Three times a day (TID) | ORAL | Status: DC | PRN

## 2020-10-02 NOTE — ED Provider Notes (Signed)
[facility] EMERGENCY DEPARTMENT Provider Note   CSN: [id number] Arrival date & time: 10/02/20  1140     History Chief Complaint  Patient presents with   Shortness of Breath    Jessica Foster is a 35 y.o. female.  35 year old female who presents with cough and shortness of breath for several days.  No fever but has had a cough.  Some orthopnea.  She has had dyspnea exertion.  No vomiting or diarrhea.  Is not vaccinated for COVID.  Does have a history of asthma.  Has used home remedies without relief      Past Medical History:  Diagnosis Date   Obesity     Patient Active Problem List   Diagnosis Date Noted   Psychotic disorder (HCC) 08/23/2019   Schizophrenia (HCC) 08/23/2019    History reviewed. No pertinent surgical history.   OB History   No obstetric history on file.     History reviewed. No pertinent family history.  Social History   Tobacco Use   Smoking status: Every Day    Packs/day: 0.50    Years: 3.00    Pack years: 1.50    Types: Cigarettes   Smokeless tobacco: Never  Substance Use Topics   Alcohol use: No   Drug use: No    Home Medications Prior to Admission medications   Medication Sig Start Date End Date Taking? Authorizing Provider  folic acid (FOLVITE) 1 MG tablet Take 1 tablet (1 mg total) by mouth daily. (May buy from over the counter): For Folate supplementation 08/29/19   Armandina Stammer I, NP  hydrochlorothiazide (MICROZIDE) 12.5 MG capsule Take 1 capsule (12.5 mg total) by mouth daily. For hypertension 08/29/19   Armandina Stammer I, NP  hydrOXYzine (ATARAX/VISTARIL) 25 MG tablet Take 1 tablet (25 mg total) by mouth 3 (three) times daily as needed for anxiety. 08/28/19   Armandina Stammer I, NP  OLANZapine zydis (ZYPREXA) 10 MG disintegrating tablet Take 1 tablet (10 mg total) by mouth at bedtime. For mood control 08/28/19   Armandina Stammer I, NP  traZODone (DESYREL) 50 MG tablet Take 1 tablet (50 mg total) by mouth at bedtime as  needed for sleep. 08/28/19   Sanjuana Kava, NP    Allergies    Patient has no known allergies.  Review of Systems   Review of Systems  All other systems reviewed and are negative.  Physical Exam Updated Vital Signs BP (!) 148/115   Pulse 100   Temp 99.4 F (37.4 C) (Oral)   Resp (!) 30   SpO2 93%   Physical Exam Vitals and nursing note reviewed.  Constitutional:      General: She is not in acute distress.    Appearance: Normal appearance. She is well-developed. She is not toxic-appearing.  HENT:     Head: Normocephalic and atraumatic.  Eyes:     General: Lids are normal.     Conjunctiva/sclera: Conjunctivae normal.     Pupils: Pupils are equal, round, and reactive to light.  Neck:     Thyroid: No thyroid mass.     Trachea: No tracheal deviation.  Cardiovascular:     Rate and Rhythm: Regular rhythm. Tachycardia present.     Heart sounds: Normal heart sounds. No murmur heard.   No gallop.  Pulmonary:     Effort: Pulmonary effort is normal. No respiratory distress.     Breath sounds: No stridor. Examination of the right-lower field reveals decreased breath sounds. Examination of the  left-lower field reveals decreased breath sounds. Decreased breath sounds present. No wheezing, rhonchi or rales.  Abdominal:     General: There is no distension.     Palpations: Abdomen is soft.     Tenderness: There is no abdominal tenderness. There is no rebound.  Musculoskeletal:        General: No tenderness. Normal range of motion.     Cervical back: Normal range of motion and neck supple.  Skin:    General: Skin is warm and dry.     Findings: No abrasion or rash.  Neurological:     Mental Status: She is alert and oriented to person, place, and time. Mental status is at baseline.     GCS: GCS eye subscore is 4. GCS verbal subscore is 5. GCS motor subscore is 6.     Cranial Nerves: Cranial nerves are intact. No cranial nerve deficit.     Sensory: No sensory deficit.     Motor:  Motor function is intact.  Psychiatric:        Attention and Perception: Attention normal.        Speech: Speech normal.        Behavior: Behavior normal.    ED Results / Procedures / Treatments   Labs (all labs ordered are listed, but only abnormal results are displayed) Labs Reviewed  CBC WITH DIFFERENTIAL/PLATELET - Abnormal; Notable for the following components:      Result Value   WBC 13.9 (*)    Hemoglobin 9.3 (*)    HCT 30.5 (*)    MCV 77.8 (*)    MCH 23.7 (*)    Neutro Abs 9.9 (*)    Eosinophils Absolute 1.5 (*)    Abs Immature Granulocytes 0.08 (*)    All other components within normal limits  I-STAT BETA HCG BLOOD, ED ([facility], [facility], [facility] ONLY) - Abnormal; Notable for the following components:   I-stat hCG, quantitative 6.0 (*)    All other components within normal limits  RESP PANEL BY RT-PCR (FLU A&B, COVID) ARPGX2  CULTURE, BLOOD (SINGLE)  BRAIN NATRIURETIC PEPTIDE  COMPREHENSIVE METABOLIC PANEL  TROPONIN I (HIGH SENSITIVITY)    EKG EKG Interpretation  Date/Time:  Saturday October 02 2020 12:17:44 EDT Ventricular Rate:  108 PR Interval:  122 QRS Duration: 84 QT Interval:  336 QTC Calculation: 450 R Axis:   56 Text Interpretation: Sinus tachycardia Otherwise normal ECG Confirmed by Lorre Nick ([id number]) on 10/02/2020 1:07:52 PM  Radiology DG Chest 2 View  Result Date: 10/02/2020 CLINICAL DATA:  Cough and shortness of breath for the past 3 days. EXAM: CHEST - 2 VIEW COMPARISON:  Chest x-ray dated January 20, 2011. FINDINGS: The heart size and mediastinal contours are within normal limits. Normal pulmonary vascularity. Patchy opacities in the right greater than left lower lobes. No pleural effusion or pneumothorax. No acute osseous abnormality. IMPRESSION: 1. Bilateral lower lobe pneumonia. Electronically Signed   By: Obie Dredge M.D.   On: 10/02/2020 12:47    Procedures Procedures   Medications Ordered in ED Medications  lactated ringers infusion (has no  administration in time range)  cefTRIAXone (ROCEPHIN) 2 g in sodium chloride 0.9 % 100 mL IVPB (has no administration in time range)  azithromycin (ZITHROMAX) 500 mg in sodium chloride 0.9 % 250 mL IVPB (has no administration in time range)  lactated ringers bolus 1,000 mL (has no administration in time range)    And  lactated ringers bolus 1,000 mL (has no administration in time range)  ED Course  I have reviewed the triage vital signs and the nursing notes.  Pertinent labs & imaging results that were available during my care of the patient were reviewed by me and considered in my medical decision making (see chart for details).    MDM Rules/Calculators/A&P                          Chest x-ray consistent with pneumonia.  Sepsis protocol started. Patient started on IV antibiotics for pneumonia.  Will admit to the hospital service Final Clinical Impression(s) / ED Diagnoses Final diagnoses:  None    Rx / DC Orders ED Discharge Orders     None        Lorre Nick, MD 10/02/20 1402

## 2020-10-02 NOTE — ED Triage Notes (Signed)
Patient coming from home, complaint of shortness of breath for several weeks. Endorses same in past when having bronchitis.

## 2020-10-02 NOTE — ED Provider Notes (Signed)
Emergency Medicine Provider Triage Evaluation Note  Jessica Foster , a 35 y.o. female  was evaluated in triage.  Pt complains of patient here with 3 weeks of shortness of breath seems to occur both with laying down at night which causes her to gasp for air and wake up also seems to be experiencing exertional shortness of breath she states that she has occasional chest pains she states that she had similar symptoms of bronchitis in the past.  She is not vaccinated against COVID-19.  She states that history of asthma but states that she felt that she has been wheezing.  States that she also has noticed foot swelling both her feet.  Review of Systems  Positive: Foot swelling, shortness of breath Negative: Fever   Physical Exam  BP (!) 129/94 (BP Location: Right Arm)   Pulse (!) 108   Temp 98.4 F (36.9 C) (Oral)   Resp 16   SpO2 93%  Gen:   Awake, no distress   Resp:  Normal effort  MSK:   Moves extremities without difficulty  Other:  Scant bilateral lower extremity edema in the feet no leg edema.  Legs are large however.  There is symmetric.  Coarse lung sounds auscultated diffusely with wheezing.  Medical Decision Making  Medically screening exam initiated at 12:20 PM.  Appropriate orders placed.  CHRISTI WIRICK was informed that the remainder of the evaluation will be completed by another provider, this initial triage assessment does not replace that evaluation, and the importance of remaining in the ED until their evaluation is complete.    Solon Augusta Epworth, [place] 10/02/20 1225    Lorre Nick, MD 10/05/20 [phone]

## 2020-10-02 NOTE — ED Notes (Signed)
Pt placed on 2L [place] due to desaturation to 85%

## 2020-10-02 NOTE — H&P (Signed)
History and Physical    Jessica Foster OZD:[id number] DOB: 09-12-1985 DOA: 10/02/2020  PCP: Patient, No Pcp Per (Inactive) (Confirm with patient/family/NH records and if not entered, this has to be entered at [facility] point of entry) Patient coming from: Home  I have personally briefly reviewed patient's old medical records in [facility]  Chief Complaint: Cough wheezing shortness of breath  HPI: Jessica Foster is a 35 y.o. female with medical history significant of morbid obesity, bronchitis, schizophrenia, presented with worsening of wheezing, cough, shortness of breath.  Symptoms started about 3 weeks ago, initially was URI-like symptoms with nasal congestion and postnasal drips, patient then developed productive cough of clear sputum and wheezing.  She tried several over-the-counter cough medications and albuterol inhalers without significant improvement.  She also experienced chills but no fever.  Her symptoms of shortness of breath more prominent when she lies down.  Denied any leg swelling, no chest pains.  She denied has history of asthma, admitted she had bronchitis several times before, no new pain and, no new medications, no new pets.  ED Course: Patient was found to be in significant respiratory distress, with breathing rate more than 30, borderline hypoxia 93% on room air, stabilized with 2 L.  Checks x-ray showed bilateral lower fields infiltrates.  Blood work WBC 13.9. BNP within normal limits.  Review of Systems: As per HPI otherwise 14 point review of systems negative.    Past Medical History:  Diagnosis Date   Obesity     History reviewed. No pertinent surgical history.   reports that she has been smoking cigarettes. She has a 1.50 pack-year smoking history. She has never used smokeless tobacco. She reports that she does not drink alcohol and does not use drugs.  No Known Allergies  History reviewed. No pertinent family history.   Prior to Admission medications    Medication Sig Start Date End Date Taking? Authorizing Provider  folic acid (FOLVITE) 1 MG tablet Take 1 tablet (1 mg total) by mouth daily. (May buy from over the counter): For Folate supplementation 08/29/19   Armandina Stammer I, NP  hydrochlorothiazide (MICROZIDE) 12.5 MG capsule Take 1 capsule (12.5 mg total) by mouth daily. For hypertension 08/29/19   Armandina Stammer I, NP  hydrOXYzine (ATARAX/VISTARIL) 25 MG tablet Take 1 tablet (25 mg total) by mouth 3 (three) times daily as needed for anxiety. 08/28/19   Armandina Stammer I, NP  OLANZapine zydis (ZYPREXA) 10 MG disintegrating tablet Take 1 tablet (10 mg total) by mouth at bedtime. For mood control 08/28/19   Armandina Stammer I, NP  traZODone (DESYREL) 50 MG tablet Take 1 tablet (50 mg total) by mouth at bedtime as needed for sleep. 08/28/19   Armandina Stammer I, NP    Physical Exam: Vitals:   10/02/20 1144 10/02/20 1313 10/02/20 1500  BP: (!) 129/94 (!) 148/115 108/64  Pulse: (!) 108 100 89  Resp: 16 (!) 30 (!) 27  Temp: 98.4 F (36.9 C) 99.4 F (37.4 C)   TempSrc: Oral Oral   SpO2: 93% 93% 100%    Constitutional: NAD, calm, comfortable Vitals:   10/02/20 1144 10/02/20 1313 10/02/20 1500  BP: (!) 129/94 (!) 148/115 108/64  Pulse: (!) 108 100 89  Resp: 16 (!) 30 (!) 27  Temp: 98.4 F (36.9 C) 99.4 F (37.4 C)   TempSrc: Oral Oral   SpO2: 93% 93% 100%   Eyes: PERRL, lids and conjunctivae normal ENMT: Mucous membranes are moist. Posterior pharynx clear of  any exudate or lesions.Normal dentition.  Neck: normal, supple, no masses, no thyromegaly Respiratory: Diminished breathing sound bilaterally, diffused wheezing wheezing, coarse crackles on bilateral lower fields.  Increasing respiratory effort.  Positive signs of accessory muscle use.  Cardiovascular: Regular rate and rhythm, no murmurs / rubs / gallops. No extremity edema. 2+ pedal pulses. No carotid bruits.  Abdomen: no tenderness, no masses palpated. No hepatosplenomegaly. Bowel sounds  positive.  Musculoskeletal: no clubbing / cyanosis. No joint deformity upper and lower extremities. Good ROM, no contractures. Normal muscle tone.  Skin: no rashes, lesions, ulcers. No induration Neurologic: CN 2-12 grossly intact. Sensation intact, DTR normal. Strength 5/5 in all 4.  Psychiatric: Normal judgment and insight. Alert and oriented x 3. Normal mood.     Labs on Admission: I have personally reviewed following labs and imaging studies  CBC: Recent Labs  Lab 10/02/20 1230  WBC 13.9*  NEUTROABS 9.9*  HGB 9.3*  HCT 30.5*  MCV 77.8*  PLT 396   Basic Metabolic Panel: No results for input(s): NA, K, CL, CO2, GLUCOSE, BUN, CREATININE, CALCIUM, MG, PHOS in the last 168 hours. GFR: CrCl cannot be calculated (Patient's most recent lab result is older than the maximum 21 days allowed.). Liver Function Tests: No results for input(s): AST, ALT, ALKPHOS, BILITOT, PROT, ALBUMIN in the last 168 hours. No results for input(s): LIPASE, AMYLASE in the last 168 hours. No results for input(s): AMMONIA in the last 168 hours. Coagulation Profile: No results for input(s): INR, PROTIME in the last 168 hours. Cardiac Enzymes: No results for input(s): CKTOTAL, CKMB, CKMBINDEX, TROPONINI in the last 168 hours. BNP (last 3 results) No results for input(s): PROBNP in the last 8760 hours. HbA1C: No results for input(s): HGBA1C in the last 72 hours. CBG: No results for input(s): GLUCAP in the last 168 hours. Lipid Profile: No results for input(s): CHOL, HDL, LDLCALC, TRIG, CHOLHDL, LDLDIRECT in the last 72 hours. Thyroid Function Tests: No results for input(s): TSH, T4TOTAL, FREET4, T3FREE, THYROIDAB in the last 72 hours. Anemia Panel: No results for input(s): VITAMINB12, FOLATE, FERRITIN, TIBC, IRON, RETICCTPCT in the last 72 hours. Urine analysis:    Component Value Date/Time   COLORURINE YELLOW 01/10/2014 1651   APPEARANCEUR CLOUDY (A) 01/10/2014 1651   LABSPEC 1.026 01/10/2014 1651    PHURINE 5.5 01/10/2014 1651   GLUCOSEU NEGATIVE 01/10/2014 1651   HGBUR LARGE (A) 01/10/2014 1651   BILIRUBINUR NEGATIVE 01/10/2014 1651   KETONESUR NEGATIVE 01/10/2014 1651   PROTEINUR NEGATIVE 01/10/2014 1651   UROBILINOGEN 0.2 01/10/2014 1651   NITRITE NEGATIVE 01/10/2014 1651   LEUKOCYTESUR NEGATIVE 01/10/2014 1651    Radiological Exams on Admission: DG Chest 2 View  Result Date: 10/02/2020 CLINICAL DATA:  Cough and shortness of breath for the past 3 days. EXAM: CHEST - 2 VIEW COMPARISON:  Chest x-ray dated January 20, 2011. FINDINGS: The heart size and mediastinal contours are within normal limits. Normal pulmonary vascularity. Patchy opacities in the right greater than left lower lobes. No pleural effusion or pneumothorax. No acute osseous abnormality. IMPRESSION: 1. Bilateral lower lobe pneumonia. Electronically Signed   By: Obie Dredge M.D.   On: 10/02/2020 12:47    EKG: Independently reviewed.  Sinus tachycardia, no acute ST changes  Assessment/Plan Active Problems:   PNA (pneumonia)  (please populate well all problems here in Problem List. (For example, if patient is on BP meds at home and you resume or decide to hold them, it is a problem that needs to be her.  Same for CAD, COPD, HLD and so on)  CAP with impending respiratory failure -Agreed with ceftriaxone and azithromycin -Check sputum culture, urine strep antigen, Legionella. -Breathing treatment and short course of steroid. -BNP within normal limits, low suspicion for CHF, positional dyspnea likely secondary to body habitus/morbid obesity, clinically there is no signs of fluid overload.  Outpatient echocardiogram and sleep study.  Acute asthma exacerbation -DuoNebs, as needed albuterol -IV Solu-Medrol x1 then p.o. steroid -Peak flow -UDS  HTN -Stable, continue HCTZ  Schizophrenia -Continue Zyprexa  Morbid obesity -Outpatient PCP follow-up   DVT prophylaxis: Lovenox Code Status: Full code Family  Communication: None at bedside Disposition Plan: Expect less than 2 midnight hospital stay Consults called: None Admission status: MedSurg observation   Emeline General MD [facility] Pager [phone]  10/02/2020, 4:20 PM

## 2020-10-02 NOTE — ED Notes (Signed)
LR not compatible with rocephin, will start LR once rocephin is complete in 30 minutes

## 2020-10-03 ENCOUNTER — Observation Stay (HOSPITAL_COMMUNITY): Payer: Medicaid Other

## 2020-10-03 MED ORDER — AZITHROMYCIN 500 MG PO TABS
250.0000 mg | ORAL_TABLET | Freq: Every day | ORAL | Status: DC
  Administered 2020-10-03: 250 mg via ORAL
  Filled 2020-10-03: qty 1

## 2020-10-03 MED ORDER — AMOXICILLIN-POT CLAVULANATE 875-125 MG PO TABS: 1.0000 | ORAL_TABLET | Freq: Two times a day (BID) | ORAL | Status: DC

## 2020-10-03 MED ORDER — SODIUM CHLORIDE 0.9 % IV SOLN
2.0000 g | INTRAVENOUS | Status: DC
  Administered 2020-10-03: 2 g via INTRAVENOUS
  Filled 2020-10-03: qty 20

## 2020-10-03 MED ORDER — SODIUM CHLORIDE 0.9 % IV SOLN
500.0000 mg | INTRAVENOUS | Status: DC
  Administered 2020-10-04: 500 mg via INTRAVENOUS
  Filled 2020-10-03 (×2): qty 500

## 2020-10-03 NOTE — Care Management (Signed)
Patient provided with [facility] letter. Instructed patient to call [facility] or any [facility] to establish with a PCP. Provided with list of [facility]. She understands that she can use [facility] low cost pharmacy once she has an appointment at any of [facility]. AVS updated

## 2020-10-03 NOTE — Progress Notes (Signed)
PROGRESS NOTE    Jessica Foster  KNL:[id number] DOB: July 22, 1985 DOA: 10/02/2020 PCP: Patient, No Pcp Per (Inactive)    Chief Complaint  Patient presents with   Shortness of Breath    Brief Narrative:   Jessica Foster is a 35 y.o. female with medical history significant of morbid obesity, bronchitis, schizophrenia, presented with worsening of wheezing, cough, shortness of breath.   Symptoms started about 3 weeks ago, initially was URI-like symptoms with nasal congestion and postnasal drips, patient then developed productive cough of clear sputum and wheezing.  She tried several over-the-counter cough medications and albuterol inhalers without significant improvement.  She also experienced chills but no fever.  Her symptoms of shortness of breath more prominent when she lies down.  Denied any leg swelling, no chest pains.  She denied has history of asthma, admitted she had bronchitis several times before, no new pain and, no new medications, no new pets.   Assessment & Plan:   Active Problems:   Community acquired pneumonia   CAP -Chest x-ray significant with bibasilar opacity, she is with no hypoxia currently, is encouraged to use incentive spirometer. -Continue with IV Rocephin and azithromycin. -She does have some wheezing, so continue with steroids -Check sputum culture, urine strep antigen, Legionella. -Breathing treatment and short course of steroid. -BNP within normal limits, low suspicion for CHF, positional dyspnea likely secondary to body habitus/morbid obesity, clinically there is no signs of fluid overload.  Outpatient echocardiogram and sleep study. -Asthma exacerbation ruled out  HTN -Stable, continue HCTZ   Schizophrenia -She reports she has not been taking any medication for last 5 years, will hold on continuing Zyprexa   Morbid obesity -Outpatient PCP follow-up  Patient is she is hCG level is 6, is not indicative of pregnancy, she denies any chances of her  being pregnant giving no sexual activity for prolonged period.   Will need Match letter on discharge([facility] pharmacy), and referral to [facility], it has been arranged by case manager.  DVT prophylaxis: Lovenox Code Status: Full Family Communication: none at bedside Disposition:   Status is: Observation    Dispo: The patient is from: Home              Anticipated d/c is to: Home              Patient currently is medically stable to d/c.   Difficult to place patient No       Consultants:  none   Subjective:  Does report generalized weakness, some dizziness with ambulation, otherwise cough and dyspnea has significantly improved Objective: Vitals:   10/03/20 0428 10/03/20 0804 10/03/20 1226 10/03/20 1415  BP: 111/64 (!) 102/46 (!) 144/79   Pulse: 90 92 92   Resp: 18 18 18 20   Temp: 98 F (36.7 C) 97.6 F (36.4 C) 98.7 F (37.1 C)   TempSrc: Oral Oral Oral   SpO2: 100%  90% 100%  Weight:      Height:        Intake/Output Summary (Last 24 hours) at 10/03/2020 1628 Last data filed at 10/02/2020 1800 Gross per 24 hour  Intake 300 ml  Output --  Net 300 ml   Filed Weights   10/02/20 1818  Weight: (!) 140.6 kg    Examination:  General exam: Appears calm and comfortable  Respiratory system: Modest air entry at the bases, with very minimal end expiratory wheezing, no respiratory distress or use of accessory muscles. Cardiovascular system: S1 & S2 heard,  RRR. No JVD, murmurs, rubs, gallops or clicks. No pedal edema. Gastrointestinal system: Abdomen is nondistended, soft and nontender. No organomegaly or masses felt. Normal bowel sounds heard. Central nervous system: Alert and oriented. No focal neurological deficits. Extremities: Symmetric 5 x 5 power. Skin: No rashes, lesions or ulcers Psychiatry: Judgement and insight appear normal. Mood & affect appropriate.     Data Reviewed: I have personally reviewed following labs and imaging  studies  CBC: Recent Labs  Lab 10/02/20 1230  WBC 13.9*  NEUTROABS 9.9*  HGB 9.3*  HCT 30.5*  MCV 77.8*  PLT 396    Basic Metabolic Panel: Recent Labs  Lab 10/02/20 1230  NA 137  K 3.8  CL 97*  CO2 31  GLUCOSE 107*  BUN 6  CREATININE 0.81  CALCIUM 8.7*    GFR: Estimated Creatinine Clearance: 132.1 mL/min (by C-G formula based on SCr of 0.81 mg/dL).  Liver Function Tests: Recent Labs  Lab 10/02/20 1230  AST 36  ALT 28  ALKPHOS 66  BILITOT 0.5  PROT 6.8  ALBUMIN 3.1*    CBG: No results for input(s): GLUCAP in the last 168 hours.   Recent Results (from the past 240 hour(s))  Resp Panel by RT-PCR (Flu A&B, Covid) Nasopharyngeal Swab     Status: None   Collection Time: 10/02/20  1:48 PM   Specimen: Nasopharyngeal Swab; Nasopharyngeal(NP) swabs in vial transport medium  Result Value Ref Range Status   SARS Coronavirus 2 by RT PCR NEGATIVE NEGATIVE Final    Comment: (NOTE) SARS-CoV-2 target nucleic acids are NOT DETECTED.  The SARS-CoV-2 RNA is generally detectable in upper respiratory specimens during the acute phase of infection. The lowest concentration of SARS-CoV-2 viral copies this assay can detect is 138 copies/mL. A negative result does not preclude SARS-Cov-2 infection and should not be used as the sole basis for treatment or other patient management decisions. A negative result may occur with  improper specimen collection/handling, submission of specimen other than nasopharyngeal swab, presence of viral mutation(s) within the areas targeted by this assay, and inadequate number of viral copies(<138 copies/mL). A negative result must be combined with clinical observations, patient history, and epidemiological information. The expected result is Negative.  Fact Sheet for Patients:  BloggerCourse.com  Fact Sheet for Healthcare Providers:  SeriousBroker.it  This test is no t yet approved or  cleared by the [place] FDA and  has been authorized for detection and/or diagnosis of SARS-CoV-2 by FDA under an Emergency Use Authorization (EUA). This EUA will remain  in effect (meaning this test can be used) for the duration of the COVID-19 declaration under Section 564(b)(1) of the Act, 21 U.S.C.section 360bbb-3(b)(1), unless the authorization is terminated  or revoked sooner.       Influenza A by PCR NEGATIVE NEGATIVE Final   Influenza B by PCR NEGATIVE NEGATIVE Final    Comment: (NOTE) The Xpert Xpress SARS-CoV-2/FLU/RSV plus assay is intended as an aid in the diagnosis of influenza from Nasopharyngeal swab specimens and should not be used as a sole basis for treatment. Nasal washings and aspirates are unacceptable for Xpert Xpress SARS-CoV-2/FLU/RSV testing.  Fact Sheet for Patients: BloggerCourse.com  Fact Sheet for Healthcare Providers: SeriousBroker.it  This test is not yet approved or cleared by the [place] FDA and has been authorized for detection and/or diagnosis of SARS-CoV-2 by FDA under an Emergency Use Authorization (EUA). This EUA will remain in effect (meaning this test can be used) for the duration of the COVID-19  declaration under Section 564(b)(1) of the Act, 21 U.S.C. section 360bbb-3(b)(1), unless the authorization is terminated or revoked.  Performed at [facility], [address] [place], [place] [address]   Culture, blood (single)     Status: None (Preliminary result)   Collection Time: 10/02/20  2:12 PM   Specimen: BLOOD  Result Value Ref Range Status   Specimen Description BLOOD LEFT ANTECUBITAL  Final   Special Requests   Final    BOTTLES DRAWN AEROBIC AND ANAEROBIC Blood Culture adequate volume   Culture   Final    NO GROWTH < 24 HOURS Performed at [facility], [address] [place], [place] [address]    Report Status PENDING  Incomplete         Radiology  Studies: DG Chest 2 View  Result Date: 10/02/2020 CLINICAL DATA:  Cough and shortness of breath for the past 3 days. EXAM: CHEST - 2 VIEW COMPARISON:  Chest x-ray dated January 20, 2011. FINDINGS: The heart size and mediastinal contours are within normal limits. Normal pulmonary vascularity. Patchy opacities in the right greater than left lower lobes. No pleural effusion or pneumothorax. No acute osseous abnormality. IMPRESSION: 1. Bilateral lower lobe pneumonia. Electronically Signed   By: Obie Dredge M.D.   On: 10/02/2020 12:47   DG Chest Port 1 View  Result Date: 10/03/2020 CLINICAL DATA:  Cough, shortness of breath. EXAM: PORTABLE CHEST 1 VIEW COMPARISON:  Chest x-ray dated 10/02/2020. FINDINGS: Persistent ill-defined airspace opacities within the RIGHT lower lung. Additional scattered interstitial prominence/ground-glass opacities bilaterally, most confluent at the LEFT lung base. No new lung findings. No pleural effusion or pneumothorax is seen. Heart size and mediastinal contours are stable. IMPRESSION: Persistent multifocal pneumonia, most confluent at the RIGHT lung base. No new lung findings. Electronically Signed   By: Bary Richard M.D.   On: 10/03/2020 10:11        Scheduled Meds:  benzonatate  200 mg Oral BID   enoxaparin (LOVENOX) injection  40 mg Subcutaneous Q24H   folic acid  1 mg Oral Daily   guaiFENesin  1,200 mg Oral BID   hydrochlorothiazide  12.5 mg Oral Daily   ipratropium-albuterol  3 mL Nebulization Q6H   OLANZapine zydis  10 mg Oral QHS   predniSONE  40 mg Oral Q breakfast   Continuous Infusions:  [START ON 10/04/2020] azithromycin     cefTRIAXone (ROCEPHIN)  IV       LOS: 0 days       Huey Bienenstock, MD [facility]   To contact the attending provider between 7A-7P or the covering provider during after hours 7P-7A, please log into the web site [email or web address] and access using universal [place] password for that web site. If you do not  have the password, please call the hospital operator.  10/03/2020, 4:28 PM

## 2020-10-03 NOTE — Discharge Instructions (Addendum)
Follow with Primary MD  in 7 days   Get CBC, CMP, checked  by Primary MD next visit.  Check to chest x-ray in 4 weeks.   Activity: As tolerated with Full fall precautions use walker/cane & assistance as needed   Disposition Home    Diet: Regular    If you experience worsening of your admission symptoms, develop shortness of breath, life threatening emergency, suicidal or homicidal thoughts you must seek medical attention immediately by calling 911 or calling your MD immediately  if symptoms less severe.  You Must read complete instructions/literature along with all the possible adverse reactions/side effects for all the Medicines you take and that have been prescribed to you. Take any new Medicines after you have completely understood and accpet all the possible adverse reactions/side effects.   Do not drive, operating heavy machinery, perform activities at heights, swimming or participation in water activities or provide baby sitting services if your were admitted for syncope or siezures until you have seen by Primary MD or a Neurologist and advised to do so again.  Do not drive when taking Pain medications.    Do not take more than prescribed Pain, Sleep and Anxiety Medications  Special Instructions: If you have smoked or chewed Tobacco  in the last 2 yrs please stop smoking, stop any regular Alcohol  and or any Recreational drug use.  Wear Seat belts while driving.   Please note  You were cared for by a hospitalist during your hospital stay. If you have any questions about your discharge medications or the care you received while you were in the hospital after you are discharged, you can call the unit and asked to speak with the hospitalist on call if the hospitalist that took care of you is not available. Once you are discharged, your primary care physician will handle any further medical issues. Please note that NO REFILLS for any discharge medications will be authorized once  you are discharged, as it is imperative that you return to your primary care physician (or establish a relationship with a primary care physician if you do not have one) for your aftercare needs so that they can reassess your need for medications and monitor your lab values.

## 2020-10-04 ENCOUNTER — Other Ambulatory Visit (HOSPITAL_COMMUNITY): Payer: Self-pay

## 2020-10-04 MED ORDER — PREDNISONE 10 MG (21) PO TBPK
ORAL_TABLET | ORAL | 0 refills | Status: AC
  Filled 2020-10-04: qty 21, 6d supply, fill #0

## 2020-10-04 MED ORDER — HYDROCHLOROTHIAZIDE 12.5 MG PO CAPS: 12.5000 mg | ORAL_CAPSULE | Freq: Every day | ORAL | 0 refills | Status: DC

## 2020-10-04 MED ORDER — FOLIC ACID 1 MG PO TABS
1.0000 mg | ORAL_TABLET | Freq: Every day | ORAL | 0 refills | Status: AC
  Filled 2020-10-04: qty 30, 30d supply, fill #0

## 2020-10-04 MED ORDER — AMOXICILLIN-POT CLAVULANATE 875-125 MG PO TABS
1.0000 | ORAL_TABLET | Freq: Two times a day (BID) | ORAL | 0 refills | Status: AC
  Filled 2020-10-04: qty 8, 4d supply, fill #0

## 2020-10-04 MED ORDER — HYDROCHLOROTHIAZIDE 12.5 MG PO TABS
12.5000 mg | ORAL_TABLET | Freq: Every day | ORAL | 0 refills | Status: AC
  Filled 2020-10-04: qty 30, 30d supply, fill #0

## 2020-10-04 MED ORDER — GUAIFENESIN ER 600 MG PO TB12
1200.0000 mg | ORAL_TABLET | Freq: Two times a day (BID) | ORAL | 0 refills | Status: AC
  Filled 2020-10-04: qty 20, 5d supply, fill #0

## 2020-10-04 NOTE — Discharge Summary (Signed)
Physician Discharge Summary  Jessica Foster WER:[id number] DOB: Sep 18, 1985 DOA: 10/02/2020  PCP: Patient, No Pcp Per (Inactive)  Admit date: 10/02/2020 Discharge date: 10/04/2020  Admitted From: Home Disposition:  Home   Recommendations for Outpatient Follow-up:  Follow up with PCP in 1-2 weeks Will need sleep study as an outpatient  Home Health:NO Equipment/Devices:No  Discharge Condition:Stable CODE STATUS:FULL Diet recommendation:  Regular   Brief/Interim Summary:    CAP -Chest x-ray significant with bibasilar opacity, she is with no hypoxia currently at rest or with activity, she ambulated in the hallway yesterday on room air and sustaining above 92%.  She is is encouraged to use incentive spirometer  on discharge. -He is treated with IV Rocephin and azithromycin during hospital stay, to finish another 4 days of oral Augmentin as an outpatient for 7 days treatment, -Initially for some wheezing, this has improved, she will be discharged on prednisone taper. -BNP within normal limits, low suspicion for CHF, positional dyspnea likely secondary to body habitus/morbid obesity, clinically there is no signs of fluid overload.  Outpatient echocardiogram and sleep study. -Asthma exacerbation ruled out   HTN -Stable, continue HCTZ   Schizophrenia -She reports she has not been taking any medication for last 5 years, will hold on continuing Zyprexa   Morbid obesity -Outpatient PCP follow-up     Discharge Diagnoses:  Active Problems:   Community acquired pneumonia    Discharge Instructions  Discharge Instructions     Diet - low sodium heart healthy   Complete by: As directed    Diet - low sodium heart healthy   Complete by: As directed    Discharge instructions   Complete by: As directed    Follow with Primary MD  in 7 days   Get CBC, CMP, checked  by Primary MD next visit.  Check to chest x-ray in 4 weeks.   Activity: As tolerated with Full fall precautions use  walker/cane & assistance as needed   Disposition Home    Diet: Regular    If you experience worsening of your admission symptoms, develop shortness of breath, life threatening emergency, suicidal or homicidal thoughts you must seek medical attention immediately by calling 911 or calling your MD immediately  if symptoms less severe.  You Must read complete instructions/literature along with all the possible adverse reactions/side effects for all the Medicines you take and that have been prescribed to you. Take any new Medicines after you have completely understood and accpet all the possible adverse reactions/side effects.   Do not drive, operating heavy machinery, perform activities at heights, swimming or participation in water activities or provide baby sitting services if your were admitted for syncope or siezures until you have seen by Primary MD or a Neurologist and advised to do so again.  Do not drive when taking Pain medications.    Do not take more than prescribed Pain, Sleep and Anxiety Medications  Special Instructions: If you have smoked or chewed Tobacco  in the last 2 yrs please stop smoking, stop any regular Alcohol  and or any Recreational drug use.  Wear Seat belts while driving.   Please note  You were cared for by a hospitalist during your hospital stay. If you have any questions about your discharge medications or the care you received while you were in the hospital after you are discharged, you can call the unit and asked to speak with the hospitalist on call if the hospitalist that took care of you is not available.  Once you are discharged, your primary care physician will handle any further medical issues. Please note that NO REFILLS for any discharge medications will be authorized once you are discharged, as it is imperative that you return to your primary care physician (or establish a relationship with a primary care physician if you do not have one) for your  aftercare needs so that they can reassess your need for medications and monitor your lab values.   Discharge instructions   Complete by: As directed    Follow with Primary MD Patient,  in 7 days   Get CBC, CMP,  checked  by Primary MD next visit.    Activity: As tolerated with Full fall precautions use walker/cane & assistance as needed   Disposition Home    Diet: Regular Diet   On your next visit with your primary care physician please Get Medicines reviewed and adjusted.   Please request your Prim.MD to go over all Hospital Tests and Procedure/Radiological results at the follow up, please get all Hospital records sent to your Prim MD by signing hospital release before you go home.   If you experience worsening of your admission symptoms, develop shortness of breath, life threatening emergency, suicidal or homicidal thoughts you must seek medical attention immediately by calling 911 or calling your MD immediately  if symptoms less severe.  You Must read complete instructions/literature along with all the possible adverse reactions/side effects for all the Medicines you take and that have been prescribed to you. Take any new Medicines after you have completely understood and accpet all the possible adverse reactions/side effects.   Do not drive, operating heavy machinery, perform activities at heights, swimming or participation in water activities or provide baby sitting services if your were admitted for syncope or siezures until you have seen by Primary MD or a Neurologist and advised to do so again.  Do not drive when taking Pain medications.    Do not take more than prescribed Pain, Sleep and Anxiety Medications  Special Instructions: If you have smoked or chewed Tobacco  in the last 2 yrs please stop smoking, stop any regular Alcohol  and or any Recreational drug use.  Wear Seat belts while driving.   Please note  You were cared for by a hospitalist during your hospital  stay. If you have any questions about your discharge medications or the care you received while you were in the hospital after you are discharged, you can call the unit and asked to speak with the hospitalist on call if the hospitalist that took care of you is not available. Once you are discharged, your primary care physician will handle any further medical issues. Please note that NO REFILLS for any discharge medications will be authorized once you are discharged, as it is imperative that you return to your primary care physician (or establish a relationship with a primary care physician if you do not have one) for your aftercare needs so that they can reassess your need for medications and monitor your lab values.   Increase activity slowly   Complete by: As directed    Increase activity slowly   Complete by: As directed       Allergies as of 10/04/2020   No Known Allergies      Medication List     STOP taking these medications    hydrOXYzine 25 MG tablet Commonly known as: ATARAX/VISTARIL   OLANZapine zydis 10 MG disintegrating tablet Commonly known as: ZYPREXA   traZODone  50 MG tablet Commonly known as: DESYREL       TAKE these medications    amoxicillin-clavulanate 875-125 MG tablet Commonly known as: Augmentin Take 1 tablet by mouth 2 (two) times daily for 4 days.   folic acid 1 MG tablet Commonly known as: FOLVITE Take 1 tablet (1 mg total) by mouth daily. (May buy from over the counter): For Folate supplementation   guaiFENesin 600 MG 12 hr tablet Commonly known as: MUCINEX Take 2 tablets (1,200 mg total) by mouth 2 (two) times daily.   hydrochlorothiazide 12.5 MG capsule Commonly known as: MICROZIDE Take 1 capsule (12.5 mg total) by mouth daily. For hypertension   predniSONE 10 MG (21) Tbpk tablet Commonly known as: STERAPRED UNI-PAK 21 TAB Use per package instruction.        Follow-up Information     [facility] Follow  up.   Why: Call to schedule an appointment to establish a primary care provider Contact information: [address] [place] [address] [phone]               No Known Allergies  Consultations: none   Procedures/Studies: DG Chest 2 View  Result Date: 10/02/2020 CLINICAL DATA:  Cough and shortness of breath for the past 3 days. EXAM: CHEST - 2 VIEW COMPARISON:  Chest x-ray dated January 20, 2011. FINDINGS: The heart size and mediastinal contours are within normal limits. Normal pulmonary vascularity. Patchy opacities in the right greater than left lower lobes. No pleural effusion or pneumothorax. No acute osseous abnormality. IMPRESSION: 1. Bilateral lower lobe pneumonia. Electronically Signed   By: Obie Dredge M.D.   On: 10/02/2020 12:47   DG Chest Port 1 View  Result Date: 10/03/2020 CLINICAL DATA:  Cough, shortness of breath. EXAM: PORTABLE CHEST 1 VIEW COMPARISON:  Chest x-ray dated 10/02/2020. FINDINGS: Persistent ill-defined airspace opacities within the RIGHT lower lung. Additional scattered interstitial prominence/ground-glass opacities bilaterally, most confluent at the LEFT lung base. No new lung findings. No pleural effusion or pneumothorax is seen. Heart size and mediastinal contours are stable. IMPRESSION: Persistent multifocal pneumonia, most confluent at the RIGHT lung base. No new lung findings. Electronically Signed   By: Bary Richard M.D.   On: 10/03/2020 10:11      Subjective: Does report some cough, weakness, no significant events overnight, no fever or chills.  Discharge Exam: Vitals:   10/04/20 0825 10/04/20 0841  BP: (!) 145/84   Pulse: 93   Resp: 18   Temp: 98.2 F (36.8 C)   SpO2: 99% 91%   Vitals:   10/03/20 2037 10/04/20 0416 10/04/20 0825 10/04/20 0841  BP: 138/83 127/70 (!) 145/84   Pulse: 91 (!) 101 93   Resp:   18   Temp: 98.4 F (36.9 C) 98.5 F (36.9 C) 98.2 F (36.8 C)   TempSrc: Oral Oral Oral    SpO2: 100% 96% 99% 91%  Weight:      Height:        General: Pt is alert, awake, not in acute distress Cardiovascular: RRR, S1/S2 +, no rubs, no gallops Respiratory: CTA bilaterally, no wheezing, no rhonchi Abdominal: Soft, NT, ND, bowel sounds + Extremities: no edema, no cyanosis    The results of significant diagnostics from this hospitalization (including imaging, microbiology, ancillary and laboratory) are listed below for reference.     Microbiology: Recent Results (from the past 240 hour(s))  Resp Panel by RT-PCR (Flu A&B, Covid) Nasopharyngeal Swab  Status: None   Collection Time: 10/02/20  1:48 PM   Specimen: Nasopharyngeal Swab; Nasopharyngeal(NP) swabs in vial transport medium  Result Value Ref Range Status   SARS Coronavirus 2 by RT PCR NEGATIVE NEGATIVE Final    Comment: (NOTE) SARS-CoV-2 target nucleic acids are NOT DETECTED.  The SARS-CoV-2 RNA is generally detectable in upper respiratory specimens during the acute phase of infection. The lowest concentration of SARS-CoV-2 viral copies this assay can detect is 138 copies/mL. A negative result does not preclude SARS-Cov-2 infection and should not be used as the sole basis for treatment or other patient management decisions. A negative result may occur with  improper specimen collection/handling, submission of specimen other than nasopharyngeal swab, presence of viral mutation(s) within the areas targeted by this assay, and inadequate number of viral copies(<138 copies/mL). A negative result must be combined with clinical observations, patient history, and epidemiological information. The expected result is Negative.  Fact Sheet for Patients:  BloggerCourse.comhttps://www.fda.gov/media/152166/download  Fact Sheet for Healthcare Providers:  SeriousBroker.ithttps://www.fda.gov/media/152162/download  This test is no t yet approved or cleared by the [place] FDA and  has been authorized for detection and/or diagnosis of SARS-CoV-2  by FDA under an Emergency Use Authorization (EUA). This EUA will remain  in effect (meaning this test can be used) for the duration of the COVID-19 declaration under Section 564(b)(1) of the Act, 21 U.S.C.section 360bbb-3(b)(1), unless the authorization is terminated  or revoked sooner.       Influenza A by PCR NEGATIVE NEGATIVE Final   Influenza B by PCR NEGATIVE NEGATIVE Final    Comment: (NOTE) The Xpert Xpress SARS-CoV-2/FLU/RSV plus assay is intended as an aid in the diagnosis of influenza from Nasopharyngeal swab specimens and should not be used as a sole basis for treatment. Nasal washings and aspirates are unacceptable for Xpert Xpress SARS-CoV-2/FLU/RSV testing.  Fact Sheet for Patients: BloggerCourse.comhttps://www.fda.gov/media/152166/download  Fact Sheet for Healthcare Providers: SeriousBroker.ithttps://www.fda.gov/media/152162/download  This test is not yet approved or cleared by the [place] FDA and has been authorized for detection and/or diagnosis of SARS-CoV-2 by FDA under an Emergency Use Authorization (EUA). This EUA will remain in effect (meaning this test can be used) for the duration of the COVID-19 declaration under Section 564(b)(1) of the Act, 21 U.S.C. section 360bbb-3(b)(1), unless the authorization is terminated or revoked.  Performed at [facility], [address] [place], [place] [address]   Culture, blood (single)     Status: None (Preliminary result)   Collection Time: 10/02/20  2:12 PM   Specimen: BLOOD  Result Value Ref Range Status   Specimen Description BLOOD LEFT ANTECUBITAL  Final   Special Requests   Final    BOTTLES DRAWN AEROBIC AND ANAEROBIC Blood Culture adequate volume   Culture   Final    NO GROWTH 2 DAYS Performed at [facility], [address] [place], [place] [address]    Report Status PENDING  Incomplete  Expectorated Sputum Assessment w Gram Stain, Rflx to Resp Cult     Status: None (Preliminary result)   Collection Time: 10/02/20   4:02 PM   Specimen: Sputum  Result Value Ref Range Status   Specimen Description SPUTUM  Final   Special Requests NONE  Final   Sputum evaluation   Final    Sputum specimen not acceptable for testing.  Please recollect.   CALLED RN JAMIE FLOY 10/04/20@3 :32 Performed at [facility], [address] [place], [place] [address]    Report Status  PENDING  Incomplete     Labs: BNP (last 3 results) Recent Labs    10/02/20 1230  BNP 20.1   Basic Metabolic Panel: Recent Labs  Lab 10/02/20 1230  NA 137  K 3.8  CL 97*  CO2 31  GLUCOSE 107*  BUN 6  CREATININE 0.81  CALCIUM 8.7*   Liver Function Tests: Recent Labs  Lab 10/02/20 1230  AST 36  ALT 28  ALKPHOS 66  BILITOT 0.5  PROT 6.8  ALBUMIN 3.1*   No results for input(s): LIPASE, AMYLASE in the last 168 hours. No results for input(s): AMMONIA in the last 168 hours. CBC: Recent Labs  Lab 10/02/20 1230  WBC 13.9*  NEUTROABS 9.9*  HGB 9.3*  HCT 30.5*  MCV 77.8*  PLT 396   Cardiac Enzymes: No results for input(s): CKTOTAL, CKMB, CKMBINDEX, TROPONINI in the last 168 hours. BNP: Invalid input(s): POCBNP CBG: No results for input(s): GLUCAP in the last 168 hours. D-Dimer No results for input(s): DDIMER in the last 72 hours. Hgb A1c No results for input(s): HGBA1C in the last 72 hours. Lipid Profile No results for input(s): CHOL, HDL, LDLCALC, TRIG, CHOLHDL, LDLDIRECT in the last 72 hours. Thyroid function studies No results for input(s): TSH, T4TOTAL, T3FREE, THYROIDAB in the last 72 hours.  Invalid input(s): FREET3 Anemia work up No results for input(s): VITAMINB12, FOLATE, FERRITIN, TIBC, IRON, RETICCTPCT in the last 72 hours. Urinalysis    Component Value Date/Time   COLORURINE YELLOW 01/10/2014 1651   APPEARANCEUR CLOUDY (A) 01/10/2014 1651   LABSPEC 1.026 01/10/2014 1651   PHURINE 5.5 01/10/2014 1651   GLUCOSEU NEGATIVE 01/10/2014 1651   HGBUR LARGE (A) 01/10/2014 1651   BILIRUBINUR NEGATIVE  01/10/2014 1651   KETONESUR NEGATIVE 01/10/2014 1651   PROTEINUR NEGATIVE 01/10/2014 1651   UROBILINOGEN 0.2 01/10/2014 1651   NITRITE NEGATIVE 01/10/2014 1651   LEUKOCYTESUR NEGATIVE 01/10/2014 1651   Sepsis Labs Invalid input(s): PROCALCITONIN,  WBC,  LACTICIDVEN Microbiology Recent Results (from the past 240 hour(s))  Resp Panel by RT-PCR (Flu A&B, Covid) Nasopharyngeal Swab     Status: None   Collection Time: 10/02/20  1:48 PM   Specimen: Nasopharyngeal Swab; Nasopharyngeal(NP) swabs in vial transport medium  Result Value Ref Range Status   SARS Coronavirus 2 by RT PCR NEGATIVE NEGATIVE Final    Comment: (NOTE) SARS-CoV-2 target nucleic acids are NOT DETECTED.  The SARS-CoV-2 RNA is generally detectable in upper respiratory specimens during the acute phase of infection. The lowest concentration of SARS-CoV-2 viral copies this assay can detect is 138 copies/mL. A negative result does not preclude SARS-Cov-2 infection and should not be used as the sole basis for treatment or other patient management decisions. A negative result may occur with  improper specimen collection/handling, submission of specimen other than nasopharyngeal swab, presence of viral mutation(s) within the areas targeted by this assay, and inadequate number of viral copies(<138 copies/mL). A negative result must be combined with clinical observations, patient history, and epidemiological information. The expected result is Negative.  Fact Sheet for Patients:  BloggerCourse.com  Fact Sheet for Healthcare Providers:  SeriousBroker.it  This test is no t yet approved or cleared by the [place] FDA and  has been authorized for detection and/or diagnosis of SARS-CoV-2 by FDA under an Emergency Use Authorization (EUA). This EUA will remain  in effect (meaning this test can be used) for the duration of the COVID-19 declaration under Section 564(b)(1) of the  Act, 21 U.S.C.section 360bbb-3(b)(1), unless the authorization  is terminated  or revoked sooner.       Influenza A by PCR NEGATIVE NEGATIVE Final   Influenza B by PCR NEGATIVE NEGATIVE Final    Comment: (NOTE) The Xpert Xpress SARS-CoV-2/FLU/RSV plus assay is intended as an aid in the diagnosis of influenza from Nasopharyngeal swab specimens and should not be used as a sole basis for treatment. Nasal washings and aspirates are unacceptable for Xpert Xpress SARS-CoV-2/FLU/RSV testing.  Fact Sheet for Patients: BloggerCourse.com  Fact Sheet for Healthcare Providers: SeriousBroker.it  This test is not yet approved or cleared by the [place] FDA and has been authorized for detection and/or diagnosis of SARS-CoV-2 by FDA under an Emergency Use Authorization (EUA). This EUA will remain in effect (meaning this test can be used) for the duration of the COVID-19 declaration under Section 564(b)(1) of the Act, 21 U.S.C. section 360bbb-3(b)(1), unless the authorization is terminated or revoked.  Performed at [facility], [address] [place], [place] [address]   Culture, blood (single)     Status: None (Preliminary result)   Collection Time: 10/02/20  2:12 PM   Specimen: BLOOD  Result Value Ref Range Status   Specimen Description BLOOD LEFT ANTECUBITAL  Final   Special Requests   Final    BOTTLES DRAWN AEROBIC AND ANAEROBIC Blood Culture adequate volume   Culture   Final    NO GROWTH 2 DAYS Performed at [facility], [address], [place], [place] [address]    Report Status PENDING  Incomplete  Expectorated Sputum Assessment w Gram Stain, Rflx to Resp Cult     Status: None (Preliminary result)   Collection Time: 10/02/20  4:02 PM   Specimen: Sputum  Result Value Ref Range Status   Specimen Description SPUTUM  Final   Special Requests NONE  Final   Sputum evaluation   Final    Sputum specimen not acceptable  for testing.  Please recollect.   CALLED RN JAMIE FLOY 10/04/20@3 :32 Performed at [facility], [address], [place], [place] [address]    Report Status PENDING  Incomplete     Time coordinating discharge: Over 30 minutes  SIGNED:   Huey Bienenstock, MD  [facility] 10/04/2020, 11:32 AM Pager   If 7PM-7AM, please contact night-coverage [email or web address] Password [email or web address]

## 2020-10-07 LAB — CULTURE, BLOOD (SINGLE)
Culture: NO GROWTH
Special Requests: ADEQUATE

## 2020-10-12 LAB — EXPECTORATED SPUTUM ASSESSMENT W GRAM STAIN, RFLX TO RESP C

## 2020-10-18 ENCOUNTER — Ambulatory Visit (INDEPENDENT_AMBULATORY_CARE_PROVIDER_SITE_OTHER): Payer: Self-pay | Admitting: Primary Care

## 2020-12-20 ENCOUNTER — Emergency Department (HOSPITAL_COMMUNITY)
Admission: EM | Admit: 2020-12-20 | Discharge: 2020-12-21 | Disposition: A | Discharge status: Home / Self Care | Payer: Medicaid Other | Attending: Emergency Medicine | Admitting: Emergency Medicine

## 2020-12-20 ENCOUNTER — Emergency Department (HOSPITAL_COMMUNITY): Payer: Medicaid Other

## 2020-12-20 DIAGNOSIS — R0602 Shortness of breath: Secondary | ICD-10-CM | POA: Diagnosis not present

## 2020-12-20 DIAGNOSIS — R059 Cough, unspecified: Secondary | ICD-10-CM | POA: Diagnosis present

## 2020-12-20 DIAGNOSIS — F17210 Nicotine dependence, cigarettes, uncomplicated: Secondary | ICD-10-CM | POA: Insufficient documentation

## 2020-12-20 DIAGNOSIS — J069 Acute upper respiratory infection, unspecified: Secondary | ICD-10-CM | POA: Diagnosis not present

## 2020-12-20 DIAGNOSIS — Z20822 Contact with and (suspected) exposure to covid-19: Secondary | ICD-10-CM | POA: Diagnosis not present

## 2020-12-20 LAB — CBC WITH DIFFERENTIAL/PLATELET
Abs Immature Granulocytes: 0.03 10*3/uL (ref 0.00–0.07)
Basophils Absolute: 0.1 10*3/uL (ref 0.0–0.1)
Basophils Relative: 1 %
Eosinophils Absolute: 0.3 10*3/uL (ref 0.0–0.5)
Eosinophils Relative: 3 %
HCT: 34.5 % — ABNORMAL LOW (ref 36.0–46.0)
Hemoglobin: 10.1 g/dL — ABNORMAL LOW (ref 12.0–15.0)
Immature Granulocytes: 0 %
Lymphocytes Relative: 15 %
Lymphs Abs: 1.5 10*3/uL (ref 0.7–4.0)
MCH: 21.6 pg — ABNORMAL LOW (ref 26.0–34.0)
MCHC: 29.3 g/dL — ABNORMAL LOW (ref 30.0–36.0)
MCV: 73.9 fL — ABNORMAL LOW (ref 80.0–100.0)
Monocytes Absolute: 0.8 10*3/uL (ref 0.1–1.0)
Monocytes Relative: 8 %
Neutro Abs: 7 10*3/uL (ref 1.7–7.7)
Neutrophils Relative %: 73 %
Platelets: 419 10*3/uL — ABNORMAL HIGH (ref 150–400)
RBC: 4.67 MIL/uL (ref 3.87–5.11)
RDW: 17.8 % — ABNORMAL HIGH (ref 11.5–15.5)
WBC: 9.6 10*3/uL (ref 4.0–10.5)
nRBC: 0 % (ref 0.0–0.2)

## 2020-12-20 LAB — BASIC METABOLIC PANEL
Anion gap: 6 (ref 5–15)
BUN: 13 mg/dL (ref 6–20)
CO2: 25 mmol/L (ref 22–32)
Calcium: 8.5 mg/dL — ABNORMAL LOW (ref 8.9–10.3)
Chloride: 105 mmol/L (ref 98–111)
Creatinine, Ser: 0.94 mg/dL (ref 0.44–1.00)
GFR, Estimated: >60 mL/min (ref >60)
Glucose, Bld: 92 mg/dL (ref 70–99)
Potassium: 3.7 mmol/L (ref 3.5–5.1)
Sodium: 136 mmol/L (ref 135–145)

## 2020-12-20 NOTE — ED Triage Notes (Signed)
Pt here POV with cold symptoms X2 days.

## 2020-12-20 NOTE — ED Provider Notes (Signed)
Emergency Medicine Provider Triage Evaluation Note  Jessica Foster , a 35 y.o. female  was evaluated in triage.  Pt complains of productive cough, shortness of breath x 2 days. Patient states she was admitted to the hospital about a month ago for pneumonia. States her symptoms initially resolved upon discharge, but she's had similar symptoms the past several days and wanted evaluation before it progresses. She also reports intermittent chest pain with coughing, nausea, and "feeling warm". Has been trying over the counter medications without relief.   Review of Systems  Positive: Shortness of breath, productive cough, nasal congestion, nausea Negative: Chest pain, abdominal pain, vomiting, diarrhea  Physical Exam  BP 133/85   Pulse (!) 102   Temp 98.4 F (36.9 C)   Resp 19   SpO2 100%  Gen:   Awake, no distress   Resp:  Normal effort  MSK:   Moves extremities without difficulty  Other:    Medical Decision Making  Medically screening exam initiated at 2:59 PM.  Appropriate orders placed.  ROSMARY DIONISIO was informed that the remainder of the evaluation will be completed by another provider, this initial triage assessment does not replace that evaluation, and the importance of remaining in the ED until their evaluation is complete.     Jeanella Flattery 12/20/20 1501    Milagros Loll, MD 12/21/20 2256

## 2020-12-21 LAB — RESP PANEL BY RT-PCR (FLU A&B, COVID) ARPGX2
Influenza A by PCR: NEGATIVE
Influenza B by PCR: NEGATIVE
SARS Coronavirus 2 by RT PCR: NEGATIVE

## 2020-12-21 MED ORDER — ACETAMINOPHEN 325 MG PO TABS
650.0000 mg | ORAL_TABLET | Freq: Once | ORAL | Status: AC
  Administered 2020-12-21: 650 mg via ORAL
  Filled 2020-12-21: qty 2

## 2020-12-21 NOTE — Discharge Instructions (Signed)
Follow-up with your primary doctor for recheck.  Come back to ER if you develop difficulty breathing or other new concerning symptom.  Please check MyChart for results regarding your COVID and flu testing.

## 2020-12-21 NOTE — ED Notes (Signed)
Visitor came back to room w/ pt food. Pt eating and in NAD at this time.

## 2020-12-21 NOTE — ED Notes (Signed)
Pt arrived to room and asked staff if she could run out to the lobby real quick d/t family member bringing her food. Explained to pt that if she let us know when her family got here we would bring her stuff to her. Pt educated on need to stay in room to be seen by a provider. Pt verbalized understanding.

## 2020-12-21 NOTE — ED Notes (Signed)
Pt ambulated from room into hallway and was standing in hall observing the commotion from a pt being loud in the hallway. Instructed pt that she needed to stay in her room. Pt ambulated back into room.

## 2020-12-21 NOTE — ED Provider Notes (Signed)
[place] EMERGENCY DEPARTMENT Provider Note   CSN: [id number] Arrival date & time: 12/20/20  1319     History Chief Complaint  Patient presents with  . URI    Jessica Foster is a 35 y.o. female.  Presents to ER with concern for cough, congestion, feeling somewhat short of breath.  Symptoms ongoing for the last 2 days or so.  Seem to be worsening yesterday and then today seem to be be improving if anything.  Had endorsed chest pain with coughing to the initial triage provider however she denies any ongoing chest pain at present.  Cough is mildly productive, mostly clear sputum.  No blood.  No known fevers.  No known sick contacts.  HPI     Past Medical History:  Diagnosis Date  . Obesity     Patient Active Problem List   Diagnosis Date Noted  . Community acquired pneumonia 10/02/2020  . Psychotic disorder ([place]) 08/23/2019  . Schizophrenia ([place]) 08/23/2019    No past surgical history on file.   OB History   No obstetric history on file.     No family history on file.  Social History   Tobacco Use  . Smoking status: Every Day    Packs/day: 0.50    Years: 3.00    Pack years: 1.50    Types: Cigarettes  . Smokeless tobacco: Never  Substance Use Topics  . Alcohol use: No  . Drug use: No    Home Medications Prior to Admission medications   Medication Sig Start Date End Date Taking? Authorizing Provider  guaiFENesin (MUCINEX) 600 MG 12 hr tablet Take 600 mg by mouth 2 (two) times daily as needed for cough or to loosen phlegm.   Yes [provider]  folic acid (FOLVITE) 1 MG tablet Take 1 tablet (1 mg total) by mouth daily. (May buy from over the counter): For Folate supplementation Patient not taking: Reported on 12/21/2020 10/04/20   Elgergawy, Silver Huguenin, MD  guaiFENesin (MUCINEX) 600 MG 12 hr tablet Take 2 tablets (1,200 mg total) by mouth 2 (two) times daily. Patient not taking: Reported on 12/21/2020 10/04/20   Elgergawy, Silver Huguenin,  MD  hydrochlorothiazide (HYDRODIURIL) 12.5 MG tablet Take 1 tablet (12.5 mg total) by mouth daily. Patient not taking: Reported on 12/21/2020 10/04/20   Elgergawy, Silver Huguenin, MD  predniSONE (STERAPRED UNI-PAK 21 TAB) 10 MG (21) TBPK tablet Use per package instruction. Patient not taking: Reported on 12/21/2020 10/04/20   Elgergawy, Silver Huguenin, MD    Allergies    Patient has no known allergies.  Review of Systems   Review of Systems  Constitutional:  Negative for chills and fever.  HENT:  Positive for congestion. Negative for ear pain and sore throat.   Eyes:  Negative for pain and visual disturbance.  Respiratory:  Positive for cough and shortness of breath.   Cardiovascular:  Negative for chest pain and palpitations.  Gastrointestinal:  Negative for abdominal pain and vomiting.  Genitourinary:  Negative for dysuria and hematuria.  Musculoskeletal:  Negative for arthralgias and back pain.  Skin:  Negative for color change and rash.  Neurological:  Negative for seizures and syncope.  All other systems reviewed and are negative.  Physical Exam Updated Vital Signs BP (!) 154/74 (BP Location: Left Arm)   Pulse 82   Temp 98.1 F (36.7 C) (Oral)   Resp 15   SpO2 97%   Physical Exam Vitals and nursing note reviewed.  Constitutional:  General: She is not in acute distress.    Appearance: She is well-developed.  HENT:     Head: Normocephalic and atraumatic.  Eyes:     Conjunctiva/sclera: Conjunctivae normal.  Cardiovascular:     Rate and Rhythm: Normal rate and regular rhythm.     Heart sounds: No murmur heard. Pulmonary:     Effort: Pulmonary effort is normal. No respiratory distress.     Breath sounds: Normal breath sounds.  Abdominal:     Palpations: Abdomen is soft.     Tenderness: There is no abdominal tenderness.  Musculoskeletal:     Cervical back: Neck supple.  Skin:    General: Skin is warm and dry.  Neurological:     General: No focal deficit present.      Mental Status: She is alert.  Psychiatric:        Mood and Affect: Mood normal.        Thought Content: Thought content normal.    ED Results / Procedures / Treatments   Labs (all labs ordered are listed, but only abnormal results are displayed) Labs Reviewed  CBC WITH DIFFERENTIAL/PLATELET - Abnormal; Notable for the following components:      Result Value   Hemoglobin 10.1 (*)    HCT 34.5 (*)    MCV 73.9 (*)    MCH 21.6 (*)    MCHC 29.3 (*)    RDW 17.8 (*)    Platelets 419 (*)    All other components within normal limits  BASIC METABOLIC PANEL - Abnormal; Notable for the following components:   Calcium 8.5 (*)    All other components within normal limits  RESP PANEL BY RT-PCR (FLU A&B, COVID) [id number]    EKG EKG Interpretation  Date/Time:  Monday December 20 2020 14:46:14 EST Ventricular Rate:  98 PR Interval:  132 QRS Duration: 74 QT Interval:  356 QTC Calculation: 454 R Axis:   45 Text Interpretation: Normal sinus rhythm Normal ECG Confirmed by Marianna Fuss ([id number]) on 12/21/2020 8:19:05 AM  Radiology DG Chest 2 View  Result Date: 12/20/2020 CLINICAL DATA:  Shortness of breath. Upper respiratory infection for 2 days. EXAM: CHEST - 2 VIEW COMPARISON:  10/03/2020 FINDINGS: The heart size and mediastinal contours are within normal limits. Both lungs are clear. Mild spurring of both AC joints. IMPRESSION: 1.  No active cardiopulmonary disease is radiographically apparent. 2. Mild spurring of both AC joints. Electronically Signed   By: Gaylyn Rong M.D.   On: 12/20/2020 16:02    Procedures Procedures   Medications Ordered in ED Medications  acetaminophen (TYLENOL) tablet 650 mg (650 mg Oral Given by Other 12/21/20 4132)    ED Course  I have reviewed the triage vital signs and the nursing notes.  Pertinent labs & imaging results that were available during my care of the patient were reviewed by me and considered in my medical decision making (see chart for  details). MDM Rules/Calculators/A&P                            35 year old lady presents to ER with concern for cough, congestion as well as some shortness of breath.  On physical exam she appears well in no acute distress.  Lung sounds are clear to auscultation.  Chest x-ray negative for pneumonia.  EKG without acute ischemic change and troponin within normal limits.  Based on her symptomatology, suspect most likely viral upper respiratory illness.  Sent COVID testing  and flu testing.  Vital signs remained stable and she is well-appearing, tolerating p.o. without difficulty.  Given current clinical appearance believe she is stable for discharge and recommend supportive care, follow-up with primary doctor.  After the discussed management above, the patient was determined to be safe for discharge.  The patient was in agreement with this plan and all questions regarding their care were answered.  ED return precautions were discussed and the patient will return to the ED with any significant worsening of condition.  Final Clinical Impression(s) / ED Diagnoses Final diagnoses:  Viral URI with cough    Rx / DC Orders ED Discharge Orders     None        Lucrezia Starch, MD 12/21/20 2222

## 2020-12-28 ENCOUNTER — Ambulatory Visit (INDEPENDENT_AMBULATORY_CARE_PROVIDER_SITE_OTHER): Payer: Self-pay | Admitting: Primary Care

## 2022-06-17 IMAGING — DX DG CHEST 2V
2 series · 2 of 2 positions shown · non-contrast
Comparison: 10/03/2020

CLINICAL DATA: Shortness of breath. Upper respiratory infection for
2 days.

EXAM:
CHEST - 2 VIEW

[w chest ap]
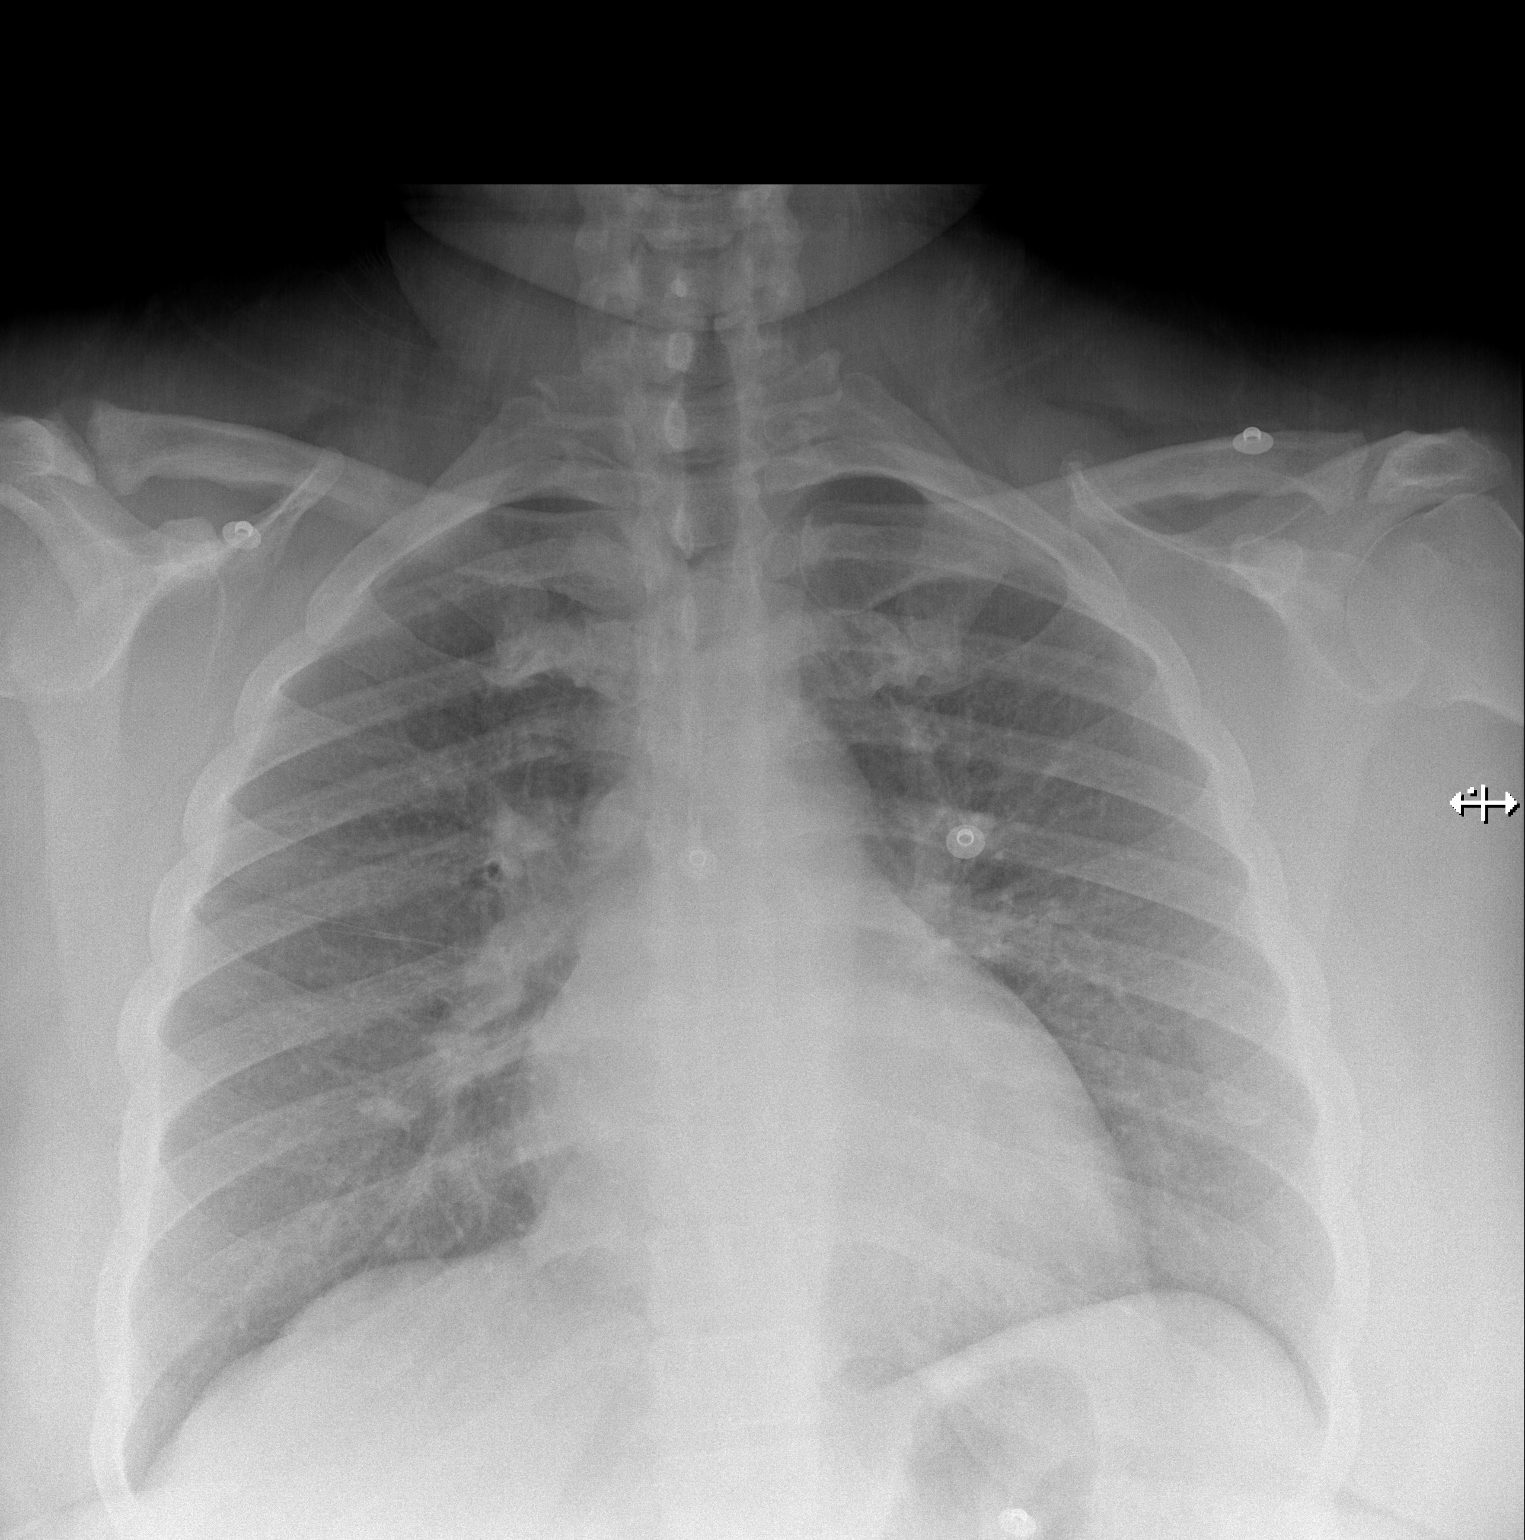

[w chest lat]
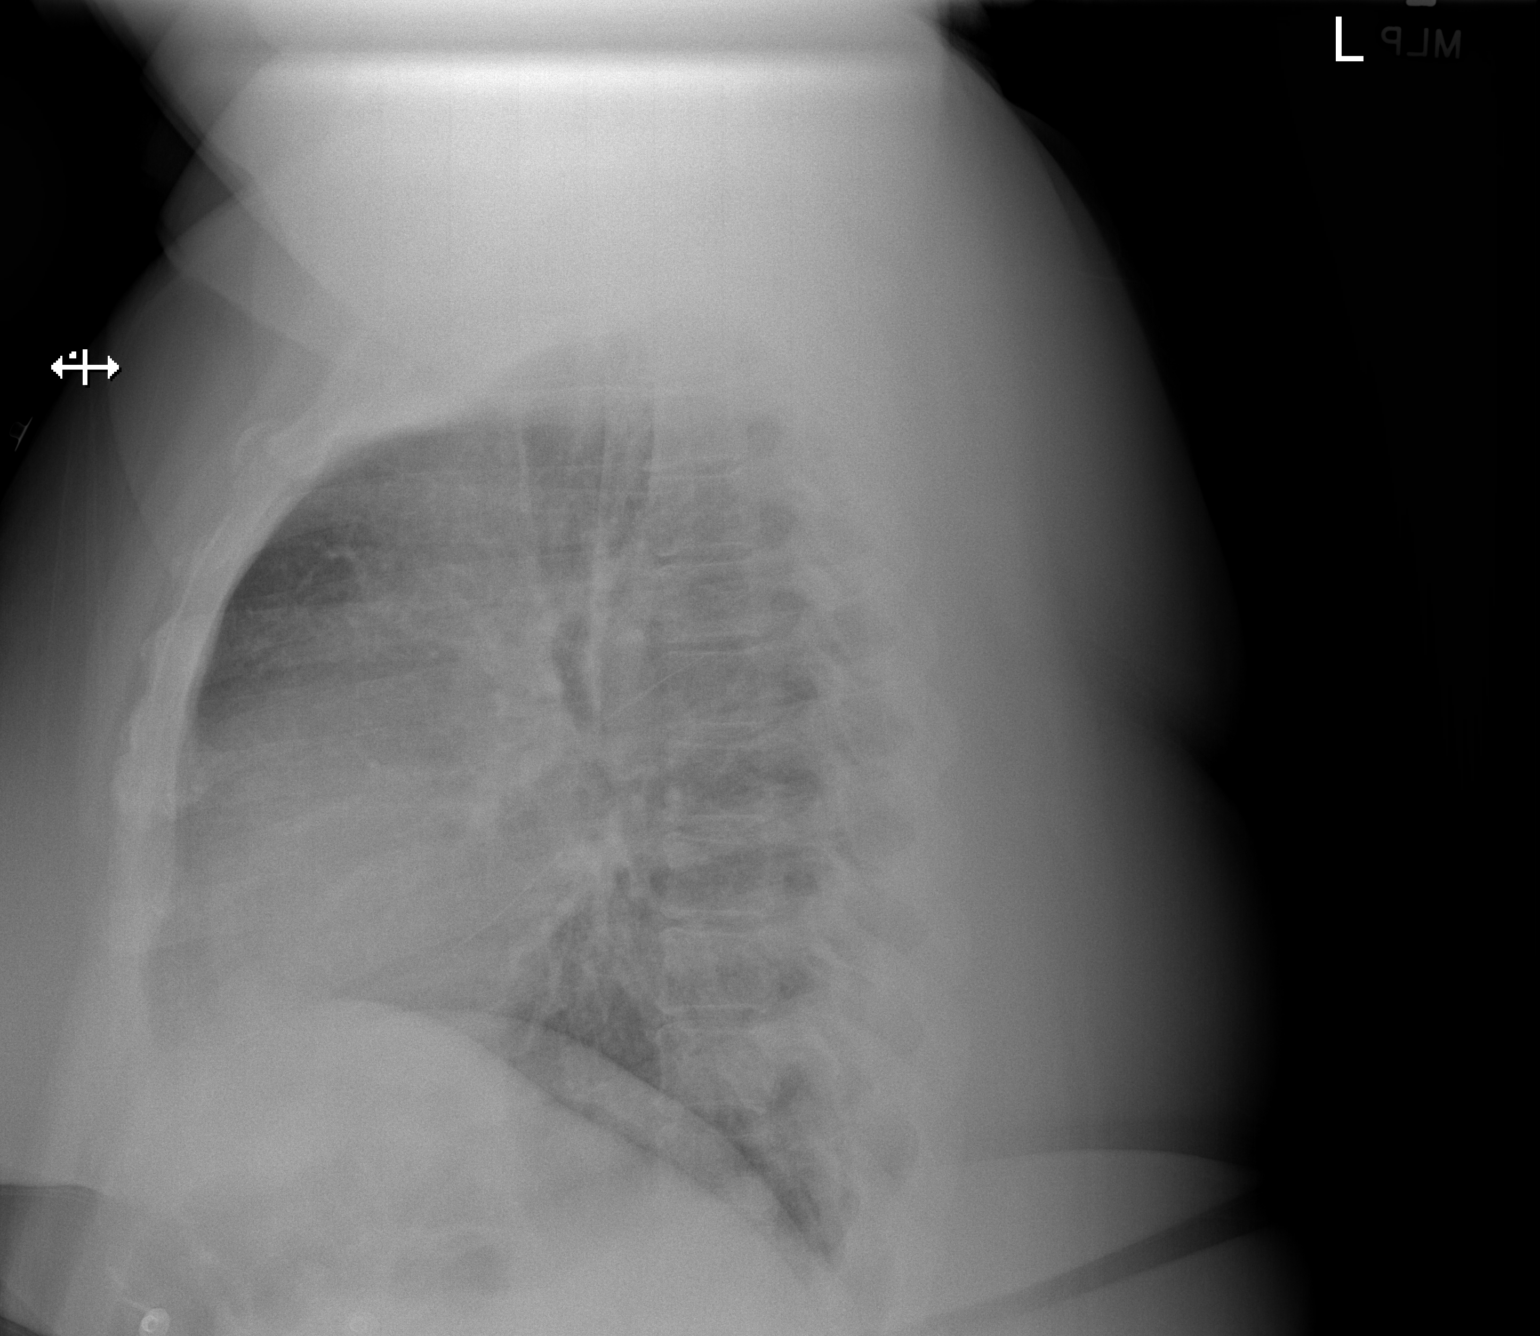

[2 of 2 positions shown; findings below may reference images not displayed]

FINDINGS: The heart size and mediastinal contours are within normal limits.
Both lungs are clear. Mild spurring of both AC joints.
IMPRESSION: 1.  No active cardiopulmonary disease is radiographically apparent.
2. Mild spurring of both AC joints.

## 2022-07-23 ENCOUNTER — Other Ambulatory Visit: Payer: Self-pay

## 2022-07-23 ENCOUNTER — Encounter (HOSPITAL_COMMUNITY): Payer: Self-pay

## 2022-07-23 ENCOUNTER — Emergency Department (HOSPITAL_COMMUNITY)
Admission: EM | Admit: 2022-07-23 | Discharge: 2022-07-24 | Disposition: A | Discharge status: Home / Self Care | Payer: Medicare Other | Attending: Emergency Medicine | Admitting: Emergency Medicine

## 2022-07-23 DIAGNOSIS — R112 Nausea with vomiting, unspecified: Secondary | ICD-10-CM | POA: Diagnosis present

## 2022-07-23 DIAGNOSIS — R1084 Generalized abdominal pain: Secondary | ICD-10-CM | POA: Diagnosis not present

## 2022-07-23 DIAGNOSIS — D72829 Elevated white blood cell count, unspecified: Secondary | ICD-10-CM | POA: Diagnosis not present

## 2022-07-23 LAB — I-STAT BETA HCG BLOOD, ED (MC, WL, AP ONLY): I-stat hCG, quantitative: <5 m[IU]/mL (ref <5)

## 2022-07-23 MED ORDER — ONDANSETRON HCL 4 MG/2ML IJ SOLN
4.0000 mg | Freq: Once | INTRAMUSCULAR | Status: AC
  Administered 2022-07-23: 4 mg via INTRAVENOUS
  Filled 2022-07-23: qty 2

## 2022-07-23 MED ORDER — SODIUM CHLORIDE 0.9 % IV BOLUS
1000.0000 mL | Freq: Once | INTRAVENOUS | Status: AC
  Administered 2022-07-23: 1000 mL via INTRAVENOUS

## 2022-07-23 NOTE — ED Provider Notes (Signed)
[place] EMERGENCY DEPARTMENT AT [facility] Provider Note   CSN: [id number] Arrival date & time: 07/23/22  2300     History {Add pertinent medical, surgical, social history, OB history to HPI:1} Chief Complaint  Patient presents with   Abdominal Pain    Jessica Foster is a 37 y.o. female.  The history is provided by the patient and medical records.  Abdominal Pain Associated symptoms: nausea and vomiting    37 year old female with history of schizophrenia, presenting to the ED with generalized abdominal pain, nausea, and vomiting after eating cook[employer] tray with burger, fries, chicken nuggets.  States meal tasted fine while eating, however almost immediately after she had a lot of GERD symptoms, abdominal discomfort, and started dry heaving.  She did have 1 episode of nonbloody, nonbilious emesis prior to EMS arrival.  She also had 2 bowel movements but denies any frank diarrhea.  States generally she does not have any digestive issues.  She denies any fever or chills.  Home Medications Prior to Admission medications   Medication Sig Start Date End Date Taking? Authorizing Provider  folic acid (FOLVITE) 1 MG tablet Take 1 tablet (1 mg total) by mouth daily. (May buy from over the counter): For Folate supplementation Patient not taking: Reported on 12/21/2020 10/04/20   Elgergawy, Leana Roe, MD  guaiFENesin (MUCINEX) 600 MG 12 hr tablet Take 2 tablets (1,200 mg total) by mouth 2 (two) times daily. Patient not taking: Reported on 12/21/2020 10/04/20   Elgergawy, Leana Roe, MD  guaiFENesin (MUCINEX) 600 MG 12 hr tablet Take 600 mg by mouth 2 (two) times daily as needed for cough or to loosen phlegm.    [provider]  hydrochlorothiazide (HYDRODIURIL) 12.5 MG tablet Take 1 tablet (12.5 mg total) by mouth daily. Patient not taking: Reported on 12/21/2020 10/04/20   Elgergawy, Leana Roe, MD  predniSONE (STERAPRED UNI-PAK 21 TAB) 10 MG (21) TBPK tablet Use per package  instruction. Patient not taking: Reported on 12/21/2020 10/04/20   Elgergawy, Leana Roe, MD      Allergies    Patient has no known allergies.    Review of Systems   Review of Systems  Gastrointestinal:  Positive for abdominal pain, nausea and vomiting.  All other systems reviewed and are negative.   Physical Exam Updated Vital Signs BP (!) 141/92   Pulse 75   Temp 98.3 F (36.8 C)   Resp 14   Ht 5\' 2"  (1.575 m)   Wt (!) 140.6 kg   SpO2 99%   BMI 56.69 kg/m   Physical Exam Vitals and nursing note reviewed.  Constitutional:      Appearance: She is well-developed.  HENT:     Head: Normocephalic and atraumatic.  Eyes:     Conjunctiva/sclera: Conjunctivae normal.     Pupils: Pupils are equal, round, and reactive to light.  Cardiovascular:     Rate and Rhythm: Normal rate and regular rhythm.     Heart sounds: Normal heart sounds.  Pulmonary:     Effort: Pulmonary effort is normal.     Breath sounds: Normal breath sounds.  Abdominal:     General: Bowel sounds are normal.     Palpations: Abdomen is soft.     Tenderness: There is no abdominal tenderness. There is no guarding or rebound.  Musculoskeletal:        General: Normal range of motion.     Cervical back: Normal range of motion.  Skin:    General: Skin  is warm and dry.  Neurological:     Mental Status: She is alert and oriented to person, place, and time.     ED Results / Procedures / Treatments   Labs (all labs ordered are listed, but only abnormal results are displayed) Labs Reviewed  CBC WITH DIFFERENTIAL/PLATELET  COMPREHENSIVE METABOLIC PANEL  LIPASE, BLOOD  I-STAT BETA HCG BLOOD, ED ([facility], [facility], [facility] ONLY)    EKG None  Radiology No results found.  Procedures Procedures  {Document cardiac monitor, telemetry assessment procedure when appropriate:1}  Medications Ordered in ED Medications  sodium chloride 0.9 % bolus 1,000 mL (has no administration in time range)  ondansetron (ZOFRAN) injection 4  mg (has no administration in time range)    ED Course/ Medical Decision Making/ A&P   {   Click here for ABCD2, HEART and other calculatorsREFRESH Note before signing :1}                          Medical Decision Making Amount and/or Complexity of Data Reviewed Labs: ordered.  Risk Prescription drug management.   ***  {Document critical care time when appropriate:1} {Document review of labs and clinical decision tools ie heart score, Chads2Vasc2 etc:1}  {Document your independent review of radiology images, and any outside records:1} {Document your discussion with family members, caretakers, and with consultants:1} {Document social determinants of health affecting pt's care:1} {Document your decision making why or why not admission, treatments were needed:1} Final Clinical Impression(s) / ED Diagnoses Final diagnoses:  None    Rx / DC Orders ED Discharge Orders     None

## 2022-07-23 NOTE — ED Triage Notes (Signed)
PT BIB by [facility] from home with a c/o of generalized abdominal pain.Pain started after someone brought her food from [employer](burger, fries, nuggets) 1 episode of vomiting, no blood in it.

## 2022-07-24 DIAGNOSIS — R112 Nausea with vomiting, unspecified: Secondary | ICD-10-CM | POA: Diagnosis not present

## 2022-07-24 LAB — CBC WITH DIFFERENTIAL/PLATELET
Abs Immature Granulocytes: 0.04 10*3/uL (ref 0.00–0.07)
Basophils Absolute: 0.1 10*3/uL (ref 0.0–0.1)
Basophils Relative: 1 %
Eosinophils Absolute: 0.3 10*3/uL (ref 0.0–0.5)
Eosinophils Relative: 2 %
HCT: 37.2 % (ref 36.0–46.0)
Hemoglobin: 11.7 g/dL — ABNORMAL LOW (ref 12.0–15.0)
Immature Granulocytes: 0 %
Lymphocytes Relative: 13 %
Lymphs Abs: 1.5 10*3/uL (ref 0.7–4.0)
MCH: 26.1 pg (ref 26.0–34.0)
MCHC: 31.5 g/dL (ref 30.0–36.0)
MCV: 82.9 fL (ref 80.0–100.0)
Monocytes Absolute: 0.7 10*3/uL (ref 0.1–1.0)
Monocytes Relative: 6 %
Neutro Abs: 8.9 10*3/uL — ABNORMAL HIGH (ref 1.7–7.7)
Neutrophils Relative %: 78 %
Platelets: 303 10*3/uL (ref 150–400)
RBC: 4.49 MIL/uL (ref 3.87–5.11)
RDW: 14.2 % (ref 11.5–15.5)
WBC: 11.5 10*3/uL — ABNORMAL HIGH (ref 4.0–10.5)
nRBC: 0 % (ref 0.0–0.2)

## 2022-07-24 LAB — COMPREHENSIVE METABOLIC PANEL
ALT: 11 U/L (ref 0–44)
AST: 13 U/L — ABNORMAL LOW (ref 15–41)
Albumin: 3.5 g/dL (ref 3.5–5.0)
Alkaline Phosphatase: 39 U/L (ref 38–126)
Anion gap: 13 (ref 5–15)
BUN: 12 mg/dL (ref 6–20)
CO2: 17 mmol/L — ABNORMAL LOW (ref 22–32)
Calcium: 8.5 mg/dL — ABNORMAL LOW (ref 8.9–10.3)
Chloride: 107 mmol/L (ref 98–111)
Creatinine, Ser: 0.95 mg/dL (ref 0.44–1.00)
GFR, Estimated: >60 mL/min (ref >60)
Glucose, Bld: 115 mg/dL — ABNORMAL HIGH (ref 70–99)
Potassium: 3.7 mmol/L (ref 3.5–5.1)
Sodium: 137 mmol/L (ref 135–145)
Total Bilirubin: 0.3 mg/dL (ref 0.3–1.2)
Total Protein: 6.3 g/dL — ABNORMAL LOW (ref 6.5–8.1)

## 2022-07-24 LAB — LIPASE, BLOOD: Lipase: 31 U/L (ref 11–51)

## 2022-07-24 MED ORDER — ONDANSETRON 4 MG PO TBDP: 4.0000 mg | ORAL_TABLET | Freq: Three times a day (TID) | ORAL | 0 refills | Status: AC | PRN

## 2022-07-24 NOTE — ED Notes (Signed)
Pt tolerating PO food with no issue.

## 2022-07-24 NOTE — Discharge Instructions (Signed)
Take the prescribed medication as directed.  Gentle diet for now, progress back to normal as tolerated.  Drink plenty of fluids. Follow-up with your primary care doctor. Return to the ED for new or worsening symptoms.

## 2022-07-24 NOTE — ED Notes (Signed)
Discharge instructions reviewed with patient. Patient questions answered and opportunity for education reviewed. Patient voices understanding of discharge instructions with no further questions. Patient ambulatory with steady gait to lobby.  

## 2023-02-25 ENCOUNTER — Other Ambulatory Visit: Payer: Self-pay

## 2023-02-25 ENCOUNTER — Encounter (HOSPITAL_COMMUNITY): Payer: Self-pay | Admitting: Emergency Medicine

## 2023-02-25 ENCOUNTER — Emergency Department (HOSPITAL_COMMUNITY)
Admission: EM | Admit: 2023-02-25 | Discharge: 2023-02-26 | Disposition: A | Discharge status: Home / Self Care | Payer: Medicare Other | Attending: Emergency Medicine | Admitting: Emergency Medicine

## 2023-02-25 DIAGNOSIS — K0889 Other specified disorders of teeth and supporting structures: Secondary | ICD-10-CM | POA: Diagnosis present

## 2023-02-25 MED ORDER — NAPROXEN 500 MG PO TABS: 500.0000 mg | ORAL_TABLET | Freq: Two times a day (BID) | ORAL | 0 refills | Status: AC

## 2023-02-25 MED ORDER — PENICILLIN V POTASSIUM 250 MG PO TABS
500.0000 mg | ORAL_TABLET | Freq: Once | ORAL | Status: AC
  Administered 2023-02-26: 500 mg via ORAL
  Filled 2023-02-25: qty 2

## 2023-02-25 MED ORDER — KETOROLAC TROMETHAMINE 30 MG/ML IJ SOLN
30.0000 mg | Freq: Once | INTRAMUSCULAR | Status: AC
  Administered 2023-02-26: 30 mg via INTRAMUSCULAR
  Filled 2023-02-25: qty 1

## 2023-02-25 MED ORDER — ACETAMINOPHEN 325 MG PO TABS
650.0000 mg | ORAL_TABLET | Freq: Once | ORAL | Status: AC
  Administered 2023-02-25: 650 mg via ORAL
  Filled 2023-02-25: qty 2

## 2023-02-25 MED ORDER — PENICILLIN V POTASSIUM 500 MG PO TABS: 500.0000 mg | ORAL_TABLET | Freq: Four times a day (QID) | ORAL | 0 refills | Status: AC

## 2023-02-25 NOTE — ED Triage Notes (Signed)
Pt reports this morning developed pain in her left cheek/gum area that is swollen. Pt rating pain 8/10. Denies recent fevers. PT awake, alert, no airway compromise noted. VSS.

## 2023-02-25 NOTE — Discharge Instructions (Addendum)
You were seen today for left facial pain and gum pain.  This is likely an underlying dental infection.  You should follow-up with dentistry.  If you develop increased facial swelling, fevers, any new or worsening symptoms, you should be reevaluated.

## 2023-02-25 NOTE — ED Provider Triage Note (Signed)
Emergency Medicine Provider Triage Evaluation Note  Jessica Foster , a 38 y.o. female  was evaluated in triage.  Pt complains of left cheek/gum pain since this morning. She has not taken anything prior to arrival for pain. Subjective fevers.  Review of Systems  Positive: Left lower gum pain Negative: Throat swelling  Physical Exam  BP 130/64 (BP Location: Right Arm)   Pulse 80   Temp 99.1 F (37.3 C) (Oral)   Resp 16   Ht 5\' 2"  (1.575 m)   Wt (!) 140 kg   SpO2 100%   BMI 56.45 kg/m  Gen:   Awake, no distress   Resp:  Normal effort  MSK:   Moves extremities without difficulty  Other:    Medical Decision Making  Medically screening exam initiated at 7:09 PM.  Appropriate orders placed.  Jessica Foster was informed that the remainder of the evaluation will be completed by another provider, this initial triage assessment does not replace that evaluation, and the importance of remaining in the ED until their evaluation is complete.    Maxwell Marion, PA-C 02/25/23 1910

## 2023-02-25 NOTE — ED Provider Notes (Signed)
[place] EMERGENCY DEPARTMENT AT [facility] Provider Note   CSN: [id number] Arrival date & time: 02/25/23  1805     History  Chief Complaint  Patient presents with   Dental Pain    Jessica Foster is a 38 y.o. female.  HPI     This is a 38 year old female who presents with left jaw and gum pain.  Patient reports onset of symptoms earlier this morning.  She states it is painful to eat and open her mouth.  No fevers.  Has not noted any overlying skin changes.  Has not seen a dentist in some time.    Home Medications Prior to Admission medications   Medication Sig Start Date End Date Taking? Authorizing Provider  folic acid (FOLVITE) 1 MG tablet Take 1 tablet (1 mg total) by mouth daily. (May buy from over the counter): For Folate supplementation Patient not taking: Reported on 12/21/2020 10/04/20   Elgergawy, Leana Roe, MD  guaiFENesin (MUCINEX) 600 MG 12 hr tablet Take 2 tablets (1,200 mg total) by mouth 2 (two) times daily. Patient not taking: Reported on 12/21/2020 10/04/20   Elgergawy, Leana Roe, MD  guaiFENesin (MUCINEX) 600 MG 12 hr tablet Take 600 mg by mouth 2 (two) times daily as needed for cough or to loosen phlegm.    [provider]  hydrochlorothiazide (HYDRODIURIL) 12.5 MG tablet Take 1 tablet (12.5 mg total) by mouth daily. Patient not taking: Reported on 12/21/2020 10/04/20   Elgergawy, Leana Roe, MD  ondansetron (ZOFRAN-ODT) 4 MG disintegrating tablet Take 1 tablet (4 mg total) by mouth every 8 (eight) hours as needed for nausea. 07/24/22   Garlon Hatchet, PA-C  predniSONE (STERAPRED UNI-PAK 21 TAB) 10 MG (21) TBPK tablet Use per package instruction. Patient not taking: Reported on 12/21/2020 10/04/20   Elgergawy, Leana Roe, MD      Allergies    Patient has no known allergies.    Review of Systems   Review of Systems  Constitutional:  Negative for fever.  HENT:  Positive for dental problem. Negative for trouble swallowing.   All other systems  reviewed and are negative.   Physical Exam Updated Vital Signs BP 128/83 (BP Location: Right Arm)   Pulse 91   Temp 98.5 F (36.9 C) (Oral)   Resp 17   Ht 1.575 m (5\' 2" )   Wt (!) 140 kg   SpO2 98%   BMI 56.45 kg/m  Physical Exam Vitals and nursing note reviewed.  Constitutional:      Appearance: She is well-developed. She is obese. She is not ill-appearing.  HENT:     Head: Normocephalic and atraumatic.     Comments: No overlying swelling or skin changes of the left cheek    Mouth/Throat:     Comments: Tenderness to palpation along the upper left gumline, no obvious dental abscess, no true trismus, no fullness noted under the tongue Eyes:     Pupils: Pupils are equal, round, and reactive to light.  Cardiovascular:     Rate and Rhythm: Normal rate and regular rhythm.  Pulmonary:     Effort: Pulmonary effort is normal. No respiratory distress.  Abdominal:     Palpations: Abdomen is soft.  Musculoskeletal:     Cervical back: Neck supple.  Skin:    General: Skin is warm and dry.  Neurological:     Mental Status: She is alert and oriented to person, place, and time.  Psychiatric:  Mood and Affect: Mood normal.     ED Results / Procedures / Treatments   Labs (all labs ordered are listed, but only abnormal results are displayed) Labs Reviewed - No data to display  EKG None  Radiology No results found.  Procedures Procedures    Medications Ordered in ED Medications  penicillin v potassium (VEETID) tablet 500 mg (has no administration in time range)  ketorolac (TORADOL) 30 MG/ML injection 30 mg (has no administration in time range)  acetaminophen (TYLENOL) tablet 650 mg (650 mg Oral Given 02/25/23 2258)    ED Course/ Medical Decision Making/ A&P                                 Medical Decision Making Risk Prescription drug management.   This patient presents to the ED for concern of facial, dental pain, this involves an extensive number of  treatment options, and is a complaint that carries with it a high risk of complications and morbidity.  I considered the following differential and admission for this acute, potentially life threatening condition.  The differential diagnosis includes abscess, underlying infection, facial cellulitis  MDM:    This is a 38 year old female who presents with left-sided dental and facial pain.  She is nontoxic and vital signs are reassuring.  She has no drainable abscess on exam but is tender over the gumline on the left upper.  Suspect underlying infection.  Will treat with penicillin and anti-inflammatories.  Patient will be given dental resources.  Have low suspicion at this time for deep space infection.  (Labs, imaging, consults)  Labs: I Ordered, and personally interpreted labs.  The pertinent results include: None  Imaging Studies ordered: I ordered imaging studies including none I independently visualized and interpreted imaging. I agree with the radiologist interpretation  Additional history obtained from chart review.  External records from outside source obtained and reviewed including prior evaluations  Cardiac Monitoring: The patient was not maintained on a cardiac monitor.  If on the cardiac monitor, I personally viewed and interpreted the cardiac monitored which showed an underlying rhythm of: N/A  Reevaluation: After the interventions noted above, I reevaluated the patient and found that they have :stayed the same  Social Determinants of Health:  lives independently  Disposition: Discharge  Co morbidities that complicate the patient evaluation  Past Medical History:  Diagnosis Date   Obesity      Medicines Meds ordered this encounter  Medications   acetaminophen (TYLENOL) tablet 650 mg   penicillin v potassium (VEETID) tablet 500 mg   ketorolac (TORADOL) 30 MG/ML injection 30 mg    I have reviewed the patients home medicines and have made adjustments as  needed  Problem List / ED Course: Problem List Items Addressed This Visit   None Visit Diagnoses       Pain, dental    -  Primary                   Final Clinical Impression(s) / ED Diagnoses Final diagnoses:  Pain, dental    Rx / DC Orders ED Discharge Orders     None         Shon Baton, MD 02/25/23 [phone]

## 2023-02-26 DIAGNOSIS — K0889 Other specified disorders of teeth and supporting structures: Secondary | ICD-10-CM | POA: Diagnosis not present

## 2023-02-26 NOTE — ED Notes (Signed)
Pt provided w/ taxi voucher upon discharge.
